# Patient Record
Sex: Female | Born: 1955
Health system: Southern US, Community
[De-identification: ages and names within clinical notes are randomized; demographics above are authoritative.]

## PROBLEM LIST (undated history)

## (undated) DIAGNOSIS — J45909 Unspecified asthma, uncomplicated: Secondary | ICD-10-CM

## (undated) DIAGNOSIS — Q185 Microstomia: Secondary | ICD-10-CM

## (undated) DIAGNOSIS — C069 Malignant neoplasm of mouth, unspecified: Secondary | ICD-10-CM

## (undated) DIAGNOSIS — K219 Gastro-esophageal reflux disease without esophagitis: Secondary | ICD-10-CM

## (undated) HISTORY — PX: WISDOM TOOTH EXTRACTION: SHX21

---

## 1998-07-01 ENCOUNTER — Ambulatory Visit (HOSPITAL_COMMUNITY): Admission: RE | Admit: 1998-07-01 | Discharge: 1998-07-01 | Payer: Self-pay | Admitting: Surgery

## 1999-07-16 ENCOUNTER — Encounter: Payer: Self-pay | Admitting: Obstetrics and Gynecology

## 1999-07-16 ENCOUNTER — Ambulatory Visit (HOSPITAL_COMMUNITY): Admission: RE | Admit: 1999-07-16 | Discharge: 1999-07-16 | Payer: Self-pay

## 2000-07-16 ENCOUNTER — Encounter: Payer: Self-pay | Admitting: Surgery

## 2000-07-16 ENCOUNTER — Ambulatory Visit (HOSPITAL_COMMUNITY): Admission: RE | Admit: 2000-07-16 | Discharge: 2000-07-16 | Payer: Self-pay | Admitting: Surgery

## 2001-07-29 ENCOUNTER — Encounter: Payer: Self-pay | Admitting: Surgery

## 2001-07-29 ENCOUNTER — Ambulatory Visit (HOSPITAL_COMMUNITY): Admission: RE | Admit: 2001-07-29 | Discharge: 2001-07-29 | Payer: Self-pay | Admitting: Surgery

## 2001-08-04 ENCOUNTER — Other Ambulatory Visit: Admission: RE | Admit: 2001-08-04 | Discharge: 2001-08-04 | Payer: Self-pay | Admitting: *Deleted

## 2002-08-15 ENCOUNTER — Ambulatory Visit (HOSPITAL_COMMUNITY): Admission: RE | Admit: 2002-08-15 | Discharge: 2002-08-15 | Payer: Self-pay | Admitting: Surgery

## 2002-08-15 ENCOUNTER — Other Ambulatory Visit: Admission: RE | Admit: 2002-08-15 | Discharge: 2002-08-15 | Payer: Self-pay | Admitting: *Deleted

## 2002-08-15 ENCOUNTER — Encounter: Payer: Self-pay | Admitting: Surgery

## 2003-09-06 ENCOUNTER — Encounter: Admission: RE | Admit: 2003-09-06 | Discharge: 2003-09-06 | Payer: Self-pay | Admitting: Surgery

## 2003-11-29 ENCOUNTER — Other Ambulatory Visit: Admission: RE | Admit: 2003-11-29 | Discharge: 2003-11-29 | Payer: Self-pay | Admitting: *Deleted

## 2004-09-10 ENCOUNTER — Encounter: Admission: RE | Admit: 2004-09-10 | Discharge: 2004-09-10 | Payer: Self-pay | Admitting: Surgery

## 2004-12-01 ENCOUNTER — Other Ambulatory Visit: Admission: RE | Admit: 2004-12-01 | Discharge: 2004-12-01 | Payer: Self-pay | Admitting: *Deleted

## 2005-02-23 ENCOUNTER — Emergency Department (HOSPITAL_COMMUNITY): Admission: EM | Admit: 2005-02-23 | Discharge: 2005-02-23 | Payer: Self-pay | Admitting: Emergency Medicine

## 2005-09-16 ENCOUNTER — Encounter: Admission: RE | Admit: 2005-09-16 | Discharge: 2005-09-16 | Payer: Self-pay | Admitting: *Deleted

## 2005-12-02 ENCOUNTER — Encounter: Payer: Self-pay | Admitting: Surgery

## 2006-01-06 ENCOUNTER — Other Ambulatory Visit: Admission: RE | Admit: 2006-01-06 | Discharge: 2006-01-06 | Payer: Self-pay | Admitting: *Deleted

## 2006-04-01 ENCOUNTER — Encounter (INDEPENDENT_AMBULATORY_CARE_PROVIDER_SITE_OTHER): Payer: Self-pay | Admitting: *Deleted

## 2006-04-01 ENCOUNTER — Ambulatory Visit (HOSPITAL_COMMUNITY): Admission: RE | Admit: 2006-04-01 | Discharge: 2006-04-01 | Payer: Self-pay | Admitting: Gastroenterology

## 2006-09-20 ENCOUNTER — Encounter: Admission: RE | Admit: 2006-09-20 | Discharge: 2006-09-20 | Payer: Self-pay | Admitting: *Deleted

## 2007-03-10 ENCOUNTER — Other Ambulatory Visit: Admission: RE | Admit: 2007-03-10 | Discharge: 2007-03-10 | Payer: Self-pay | Admitting: *Deleted

## 2007-09-22 ENCOUNTER — Encounter: Admission: RE | Admit: 2007-09-22 | Discharge: 2007-09-22 | Payer: Self-pay | Admitting: *Deleted

## 2008-09-24 ENCOUNTER — Encounter: Admission: RE | Admit: 2008-09-24 | Discharge: 2008-09-24 | Payer: Self-pay | Admitting: Obstetrics & Gynecology

## 2009-08-03 HISTORY — PX: EXCISION OF TONGUE LESION WITH LASER: SHX5823

## 2009-10-08 ENCOUNTER — Encounter: Admission: RE | Admit: 2009-10-08 | Discharge: 2009-10-08 | Payer: Self-pay | Admitting: Obstetrics & Gynecology

## 2010-01-27 ENCOUNTER — Ambulatory Visit (HOSPITAL_BASED_OUTPATIENT_CLINIC_OR_DEPARTMENT_OTHER): Admission: RE | Admit: 2010-01-27 | Discharge: 2010-01-27 | Payer: Self-pay | Admitting: Otolaryngology

## 2010-09-30 ENCOUNTER — Other Ambulatory Visit: Payer: Self-pay | Admitting: Obstetrics & Gynecology

## 2010-09-30 DIAGNOSIS — Z1231 Encounter for screening mammogram for malignant neoplasm of breast: Secondary | ICD-10-CM

## 2010-10-13 ENCOUNTER — Ambulatory Visit
Admission: RE | Admit: 2010-10-13 | Discharge: 2010-10-13 | Disposition: A | Discharge status: Home / Self Care | Payer: Commercial Managed Care - PPO | Source: Ambulatory Visit | Attending: Obstetrics & Gynecology | Admitting: Obstetrics & Gynecology

## 2010-10-13 DIAGNOSIS — Z1231 Encounter for screening mammogram for malignant neoplasm of breast: Secondary | ICD-10-CM

## 2010-12-19 NOTE — Op Note (Signed)
NAME:  Joan Boyle, Joan Boyle                 ACCOUNT NO.:  [id number]   MEDICAL RECORD NO.:  [id number]          PATIENT TYPE:  AMB   LOCATION:  ENDO                         FACILITY:  [facility]   PHYSICIAN:  James L. Malon Kindle., M.D.DATE OF BIRTH:  30-Nov-1955   DATE OF PROCEDURE:  04/01/2006  DATE OF DISCHARGE:                                 OPERATIVE REPORT   PREOPERATIVE DIAGNOSIS:  Colon cancer screening.   POSTOPERATIVE DIAGNOSIS:   PROCEDURE:  Colonoscopy and coagulation of polyp.   SCOPE USED:  Pediatric adjustable colonoscope.   SURGEON:  James L. Randa Evens, M.D.   ANESTHESIA:  Fentanyl 100 mcg and Versed 10 mg IV.   PROCEDURE:  The procedure was explained to the patient and consent obtained.  In the left lateral decubitus position the Olympus pediatric adjustable  scope was inserted and advanced.  The prep was excellent.  We were able to  reach the transverse colon without difficulty and then placed the patient on  her back.  Using some slight abdominal pressure we were able to advance down  to the cecum.  The ileocecal valve and appendiceal orifice were seen. The  scope was withdrawn.  The cecum, ascending, transverse colon were seen well  and were free of polyps.  In the proximal and descending colon a 3 mm  sessile polyp was cauterized with the hot biopsy forceps.  There was no  diverticular disease in the left colon.  No other polyps were seen in the  sigmoid or rectum.  The scope was withdrawn.  The patient tolerated the  procedure well.  There were a few hemorrhoids in the anal canal.   ASSESSMENT:  Descending colon polyp, cauterized.   PLAN:  Will check pathology, and if adenomatous will recommend routine  follow up.  Will hold nonsteroidals per the usual case.           ______________________________  Llana Aliment. Malon Kindle., M.D.     Waldron Session  D:  04/01/2006  T:  04/01/2006  Job:  [id number]   cc:   Alinda Sierras, PA

## 2011-10-14 ENCOUNTER — Other Ambulatory Visit: Payer: Self-pay | Admitting: Obstetrics & Gynecology

## 2011-10-14 DIAGNOSIS — Z1231 Encounter for screening mammogram for malignant neoplasm of breast: Secondary | ICD-10-CM

## 2011-11-02 ENCOUNTER — Ambulatory Visit
Admission: RE | Admit: 2011-11-02 | Discharge: 2011-11-02 | Disposition: A | Discharge status: Home / Self Care | Payer: 59 | Source: Ambulatory Visit | Attending: Obstetrics & Gynecology | Admitting: Obstetrics & Gynecology

## 2011-11-02 DIAGNOSIS — Z1231 Encounter for screening mammogram for malignant neoplasm of breast: Secondary | ICD-10-CM

## 2012-06-28 NOTE — Progress Notes (Signed)
Joan Boyle talked with pt

## 2012-06-29 NOTE — H&P (Signed)
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 Discussed  No change following the course of Kenalog/orabase. Anterior floor of mouth lesion, unchaged. It is approximately 8-10 mm in diameter and non-raised. No palpable adenopathy. Recommend biopsy and laser excision of this lesion. We can do this in the office under local anesthesia. Reason For Visit  Recheck mouth. Allergies  Latex Gloves MISC Penicillins; Wheezing,Rash Toradol Oral TABS. Current Meds  Calcium TABS;; RPT Vitamin D CAPS;; RPT Maxair AERO;; RPT. 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 P[facility]  Oral Surgery Tooth Extraction. Family Hx  DVT of leg (deep venous thrombosis): Sister (I82.409) Family history of atrial fibrillation: Mother,Grandfather (V17.49) (Z82.49) Family history of coronary artery disease: Father (V17.3) (Z32.49) Family history of lung cancer: Father (V16.1) (Z80.1) Family history of transient ischemic attacks: Sister (V17.1) (Z82.3). Personal Hx  Alcohol Use (History); wine 1-2 glasses weekly Caffeine use Never a smoker Never a smoker Occasional alcohol use. Signature  Electronically signed by : Serena Colonel  M.D.; 05/30/2012 8:56 PM EST.

## 2012-07-03 DIAGNOSIS — C069 Malignant neoplasm of mouth, unspecified: Secondary | ICD-10-CM

## 2012-07-03 HISTORY — DX: Malignant neoplasm of mouth, unspecified: C06.9

## 2012-07-04 ENCOUNTER — Encounter (HOSPITAL_BASED_OUTPATIENT_CLINIC_OR_DEPARTMENT_OTHER): Payer: Self-pay | Admitting: *Deleted

## 2012-07-04 ENCOUNTER — Encounter (HOSPITAL_BASED_OUTPATIENT_CLINIC_OR_DEPARTMENT_OTHER)
Admission: RE | Disposition: A | Discharge status: Home / Self Care | Payer: Self-pay | Source: Ambulatory Visit | Attending: Otolaryngology

## 2012-07-04 ENCOUNTER — Ambulatory Visit (HOSPITAL_BASED_OUTPATIENT_CLINIC_OR_DEPARTMENT_OTHER)
Admission: RE | Admit: 2012-07-04 | Discharge: 2012-07-04 | Disposition: A | Discharge status: Home / Self Care | Payer: 59 | Source: Ambulatory Visit | Attending: Otolaryngology | Admitting: Otolaryngology

## 2012-07-04 DIAGNOSIS — K1370 Unspecified lesions of oral mucosa: Secondary | ICD-10-CM | POA: Insufficient documentation

## 2012-07-04 DIAGNOSIS — Z8249 Family history of ischemic heart disease and other diseases of the circulatory system: Secondary | ICD-10-CM | POA: Insufficient documentation

## 2012-07-04 DIAGNOSIS — Z801 Family history of malignant neoplasm of trachea, bronchus and lung: Secondary | ICD-10-CM | POA: Insufficient documentation

## 2012-07-04 DIAGNOSIS — Z823 Family history of stroke: Secondary | ICD-10-CM | POA: Insufficient documentation

## 2012-07-04 DIAGNOSIS — D490 Neoplasm of unspecified behavior of digestive system: Secondary | ICD-10-CM

## 2012-07-04 DIAGNOSIS — Z880 Allergy status to penicillin: Secondary | ICD-10-CM | POA: Insufficient documentation

## 2012-07-04 HISTORY — DX: Unspecified asthma, uncomplicated: J45.909

## 2012-07-04 SURGERY — MINOR C02 LASER EXCISION OF ORAL LESION
Anesthesia: LOCAL | Site: Mouth | Wound class: Clean Contaminated

## 2012-07-04 MED ORDER — HYDROCODONE-ACETAMINOPHEN 7.5-325 MG PO TABS: 1.0000 | ORAL_TABLET | Freq: Four times a day (QID) | ORAL | Status: DC | PRN

## 2012-07-04 MED ORDER — LIDOCAINE-EPINEPHRINE 1 %-1:100000 IJ SOLN
INTRAMUSCULAR | Status: DC | PRN
  Administered 2012-07-04: 1.5 mL

## 2012-07-04 SURGICAL SUPPLY — 36 items
BLADE SURG 15 STRL LF DISP TIS (BLADE) ×1 IMPLANT
BLADE SURG 15 STRL SS (BLADE) ×1
CANISTER SUCTION 1200CC (MISCELLANEOUS) ×2 IMPLANT
CLOTH BEACON ORANGE TIMEOUT ST (SAFETY) IMPLANT
COAGULATOR SUCT SWTCH 10FR 6 (ELECTROSURGICAL) IMPLANT
DEPRESSOR TONGUE BLADE STERILE (MISCELLANEOUS) ×2 IMPLANT
ELECT COATED BLADE 2.86 ST (ELECTRODE) IMPLANT
ELECT REM PT RETURN 9FT ADLT (ELECTROSURGICAL)
ELECTRODE REM PT RTRN 9FT ADLT (ELECTROSURGICAL) IMPLANT
FILTER 7/8 IN (FILTER) ×2 IMPLANT
GAUZE SPONGE 4X4 12PLY STRL LF (GAUZE/BANDAGES/DRESSINGS) IMPLANT
GLOVE ECLIPSE 7.5 STRL STRAW (GLOVE) IMPLANT
GLOVE SKINSENSE NS SZ6.5 (GLOVE) ×1
GLOVE SKINSENSE NS SZ7.5 (GLOVE) ×1
GLOVE SKINSENSE STRL SZ6.5 (GLOVE) ×1 IMPLANT
GLOVE SKINSENSE STRL SZ7.5 (GLOVE) ×1 IMPLANT
GOWN PREVENTION PLUS XLARGE (GOWN DISPOSABLE) ×2 IMPLANT
GOWN PREVENTION PLUS XXLARGE (GOWN DISPOSABLE) IMPLANT
MARKER SKIN DUAL TIP RULER LAB (MISCELLANEOUS) IMPLANT
NEEDLE 27GAX1X1/2 (NEEDLE) IMPLANT
NEEDLE HYPO 30X.5 LL (NEEDLE) ×2 IMPLANT
NS IRRIG 1000ML POUR BTL (IV SOLUTION) ×2 IMPLANT
PACK BASIN DAY SURGERY FS (CUSTOM PROCEDURE TRAY) ×2 IMPLANT
PATTIES SURGICAL .5 X3 (DISPOSABLE) IMPLANT
PENCIL FOOT CONTROL (ELECTRODE) IMPLANT
REDUCTION FITTING 1/4 IN (FILTER) ×2 IMPLANT
SHEET MEDIUM DRAPE 40X70 STRL (DRAPES) ×2 IMPLANT
SLEEVE SCD COMPRESS KNEE MED (MISCELLANEOUS) IMPLANT
SUT SILK 3 0 PS 1 (SUTURE) IMPLANT
SUT VIC AB 3-0 SH 27 (SUTURE)
SUT VIC AB 3-0 SH 27X BRD (SUTURE) IMPLANT
SYR BULB 3OZ (MISCELLANEOUS) IMPLANT
SYR CONTROL 10ML LL (SYRINGE) ×2 IMPLANT
TOWEL OR 17X24 6PK STRL BLUE (TOWEL DISPOSABLE) ×2 IMPLANT
TUBE CONNECTING 20X1/4 (TUBING) ×2 IMPLANT
WATER STERILE IRR 1000ML POUR (IV SOLUTION) IMPLANT

## 2012-07-04 NOTE — Interval H&P Note (Signed)
History and Physical Interval Note:  07/04/2012 8:04 AM  Joan Boyle  has presented today for surgery, with the diagnosis of oral lesion   The various methods of treatment have been discussed with the patient and family. After consideration of risks, benefits and other options for treatment, the patient has consented to  Procedure(s) (LRB) with comments: MINOR C02 LASER EXCISION OF ORAL LESION (N/A) - BIOPSY OF FLOOR OF MOUTH WITH CO2 LASER AND EXCISION OF LESION  as a surgical intervention .  The patient's history has been reviewed, patient examined, no change in status, stable for surgery.  I have reviewed the patient's chart and labs.  Questions were answered to the patient's satisfaction.     Migel Hannis

## 2012-07-04 NOTE — Op Note (Signed)
OPERATIVE REPORT  DATE OF SURGERY: 07/04/2012  PATIENT:  Joan Boyle,  56 y.o. female  PRE-OPERATIVE DIAGNOSIS:  Oral lesion   POST-OPERATIVE DIAGNOSIS:  Oral lesion   PROCEDURE:  Procedure(s): MINOR C02 LASER EXCISION OF ORAL LESION  SURGEON:  Susy Frizzle, MD  ASSISTANTS: none   ANESTHESIA:   local  EBL:  0 ml  DRAINS: none   LOCAL MEDICATIONS USED:  XYLOCAINE with epi SPECIMEN:  Source of Specimen:  floor of mouth mucosal lesion  COUNTS:  YES  PROCEDURE DETAILS: The patient was taken to the operating room and placed on the operating table in the supine position drapes were applied in standard fashion. 4% Xylocaine with epinephrine was infiltrated into the floor of mouth using a 30-gauge needle. A total of approximately 1 cc was injected. A biopsy forceps was used to remove a small piece of mucosa and this was sent for pathologic evaluation. The CO2 laser was then used on a setting of 2 W continuous power to obliterate the superficial layer of mucosa. Passes were performed, suctioning and scraping off the eschar. After all the abnormal mucosa was removed, there was no bleeding. The procedure was then completed. The patient was then transferred to recovery in stable condition per   PATIENT DISPOSITION:  PACU - hemodynamically stable.

## 2012-07-21 ENCOUNTER — Encounter (HOSPITAL_BASED_OUTPATIENT_CLINIC_OR_DEPARTMENT_OTHER): Payer: Self-pay | Admitting: *Deleted

## 2012-08-01 ENCOUNTER — Encounter (HOSPITAL_BASED_OUTPATIENT_CLINIC_OR_DEPARTMENT_OTHER): Payer: Self-pay | Admitting: Anesthesiology

## 2012-08-01 ENCOUNTER — Ambulatory Visit (HOSPITAL_BASED_OUTPATIENT_CLINIC_OR_DEPARTMENT_OTHER)
Admission: RE | Admit: 2012-08-01 | Discharge: 2012-08-01 | Disposition: A | Discharge status: Home / Self Care | Payer: 59 | Source: Ambulatory Visit | Attending: Otolaryngology | Admitting: Otolaryngology

## 2012-08-01 ENCOUNTER — Encounter (HOSPITAL_BASED_OUTPATIENT_CLINIC_OR_DEPARTMENT_OTHER)
Admission: RE | Disposition: A | Discharge status: Home / Self Care | Payer: Self-pay | Source: Ambulatory Visit | Attending: Otolaryngology

## 2012-08-01 ENCOUNTER — Encounter (HOSPITAL_BASED_OUTPATIENT_CLINIC_OR_DEPARTMENT_OTHER): Payer: Self-pay

## 2012-08-01 ENCOUNTER — Ambulatory Visit (HOSPITAL_BASED_OUTPATIENT_CLINIC_OR_DEPARTMENT_OTHER): Payer: 59 | Admitting: Anesthesiology

## 2012-08-01 DIAGNOSIS — C049 Malignant neoplasm of floor of mouth, unspecified: Secondary | ICD-10-CM | POA: Insufficient documentation

## 2012-08-01 DIAGNOSIS — J45990 Exercise induced bronchospasm: Secondary | ICD-10-CM | POA: Insufficient documentation

## 2012-08-01 DIAGNOSIS — Z880 Allergy status to penicillin: Secondary | ICD-10-CM | POA: Insufficient documentation

## 2012-08-01 DIAGNOSIS — Z91040 Latex allergy status: Secondary | ICD-10-CM | POA: Insufficient documentation

## 2012-08-01 DIAGNOSIS — C069 Malignant neoplasm of mouth, unspecified: Secondary | ICD-10-CM

## 2012-08-01 DIAGNOSIS — K219 Gastro-esophageal reflux disease without esophagitis: Secondary | ICD-10-CM | POA: Insufficient documentation

## 2012-08-01 HISTORY — DX: Gastro-esophageal reflux disease without esophagitis: K21.9

## 2012-08-01 HISTORY — PX: SALIVARY STONE REMOVAL: SHX5213

## 2012-08-01 HISTORY — DX: Malignant neoplasm of mouth, unspecified: C06.9

## 2012-08-01 HISTORY — DX: Microstomia: Q18.5

## 2012-08-01 LAB — POCT HEMOGLOBIN-HEMACUE: Hemoglobin: 13.9 g/dL (ref 12.0–15.0)

## 2012-08-01 SURGERY — REMOVAL, CALCULUS, SALIVARY DUCT
Anesthesia: General | Site: Mouth | Laterality: Bilateral | Wound class: Clean Contaminated

## 2012-08-01 MED ORDER — LACTATED RINGERS IV SOLN
INTRAVENOUS | Status: DC
  Administered 2012-08-01 (×3): via INTRAVENOUS

## 2012-08-01 MED ORDER — MIDAZOLAM HCL 2 MG/2ML IJ SOLN: 1.0000 mg | INTRAMUSCULAR | Status: DC | PRN

## 2012-08-01 MED ORDER — SUCCINYLCHOLINE CHLORIDE 20 MG/ML IJ SOLN
INTRAMUSCULAR | Status: DC | PRN
  Administered 2012-08-01: 80 mg via INTRAVENOUS

## 2012-08-01 MED ORDER — NEOSTIGMINE METHYLSULFATE 1 MG/ML IJ SOLN
INTRAMUSCULAR | Status: DC | PRN
  Administered 2012-08-01: 3 mg via INTRAVENOUS

## 2012-08-01 MED ORDER — ROCURONIUM BROMIDE 100 MG/10ML IV SOLN
INTRAVENOUS | Status: DC | PRN
  Administered 2012-08-01: 20 mg via INTRAVENOUS

## 2012-08-01 MED ORDER — DEXAMETHASONE SODIUM PHOSPHATE 4 MG/ML IJ SOLN
INTRAMUSCULAR | Status: DC | PRN
  Administered 2012-08-01: 10 mg via INTRAVENOUS

## 2012-08-01 MED ORDER — LIDOCAINE HCL 4 % MT SOLN
OROMUCOSAL | Status: DC | PRN
  Administered 2012-08-01: 2 mL via TOPICAL

## 2012-08-01 MED ORDER — LIDOCAINE-EPINEPHRINE 1 %-1:100000 IJ SOLN
INTRAMUSCULAR | Status: DC | PRN
  Administered 2012-08-01: 3 mL

## 2012-08-01 MED ORDER — KETOROLAC TROMETHAMINE 30 MG/ML IJ SOLN
INTRAMUSCULAR | Status: DC | PRN
  Administered 2012-08-01: 30 mg via INTRAVENOUS

## 2012-08-01 MED ORDER — CLINDAMYCIN HCL 300 MG PO CAPS: 300.0000 mg | ORAL_CAPSULE | Freq: Three times a day (TID) | ORAL | Status: DC

## 2012-08-01 MED ORDER — EPHEDRINE SULFATE 50 MG/ML IJ SOLN
INTRAMUSCULAR | Status: DC | PRN
  Administered 2012-08-01: 10 mg via INTRAVENOUS

## 2012-08-01 MED ORDER — MIDAZOLAM HCL 5 MG/5ML IJ SOLN
INTRAMUSCULAR | Status: DC | PRN
  Administered 2012-08-01: 2 mg via INTRAVENOUS

## 2012-08-01 MED ORDER — FENTANYL CITRATE 0.05 MG/ML IJ SOLN
INTRAMUSCULAR | Status: DC | PRN
  Administered 2012-08-01: 100 ug via INTRAVENOUS

## 2012-08-01 MED ORDER — PROPOFOL 10 MG/ML IV BOLUS
INTRAVENOUS | Status: DC | PRN
  Administered 2012-08-01: 220 mg via INTRAVENOUS
  Administered 2012-08-01: 50 mg via INTRAVENOUS

## 2012-08-01 MED ORDER — CLINDAMYCIN PHOSPHATE 600 MG/50ML IV SOLN
INTRAVENOUS | Status: DC | PRN
  Administered 2012-08-01: 600 mg via INTRAVENOUS

## 2012-08-01 MED ORDER — FENTANYL CITRATE 0.05 MG/ML IJ SOLN
50.0000 ug | INTRAMUSCULAR | Status: DC | PRN
Start: 2012-08-01 — End: 2012-08-01

## 2012-08-01 MED ORDER — LIDOCAINE HCL (CARDIAC) 20 MG/ML IV SOLN
INTRAVENOUS | Status: DC | PRN
  Administered 2012-08-01: 60 mg via INTRAVENOUS

## 2012-08-01 MED ORDER — 0.9 % SODIUM CHLORIDE (POUR BTL) OPTIME
TOPICAL | Status: DC | PRN
  Administered 2012-08-01: 50 mL

## 2012-08-01 MED ORDER — OXYCODONE HCL 5 MG PO TABS: 5.0000 mg | ORAL_TABLET | Freq: Once | ORAL | Status: DC | PRN

## 2012-08-01 MED ORDER — MEPERIDINE HCL 25 MG/ML IJ SOLN: 6.2500 mg | INTRAMUSCULAR | Status: DC | PRN

## 2012-08-01 MED ORDER — PROMETHAZINE HCL 25 MG/ML IJ SOLN: 6.2500 mg | INTRAMUSCULAR | Status: DC | PRN

## 2012-08-01 MED ORDER — OXYCODONE HCL 5 MG/5ML PO SOLN: 5.0000 mg | Freq: Once | ORAL | Status: DC | PRN

## 2012-08-01 MED ORDER — HYDROMORPHONE HCL PF 1 MG/ML IJ SOLN: 0.2500 mg | INTRAMUSCULAR | Status: DC | PRN

## 2012-08-01 MED ORDER — GLYCOPYRROLATE 0.2 MG/ML IJ SOLN
INTRAMUSCULAR | Status: DC | PRN
  Administered 2012-08-01: 0.4 mg via INTRAVENOUS

## 2012-08-01 SURGICAL SUPPLY — 37 items
BLADE SURG 15 STRL LF DISP TIS (BLADE) ×1 IMPLANT
BLADE SURG 15 STRL SS (BLADE) ×1
CANISTER SUCTION 1200CC (MISCELLANEOUS) ×2 IMPLANT
CLEANER CAUTERY TIP 5X5 PAD (MISCELLANEOUS) ×1 IMPLANT
CLOTH BEACON ORANGE TIMEOUT ST (SAFETY) ×2 IMPLANT
COVER MAYO STAND STRL (DRAPES) ×2 IMPLANT
COVER TABLE BACK 60X90 (DRAPES) ×2 IMPLANT
DECANTER SPIKE VIAL GLASS SM (MISCELLANEOUS) IMPLANT
DEPRESSOR TONGUE BLADE STERILE (MISCELLANEOUS) ×2 IMPLANT
DRAPE MICROSCOPE URBAN (DRAPES) ×2 IMPLANT
ELECT COATED BLADE 2.86 ST (ELECTRODE) ×2 IMPLANT
ELECT REM PT RETURN 9FT ADLT (ELECTROSURGICAL)
ELECTRODE REM PT RTRN 9FT ADLT (ELECTROSURGICAL) IMPLANT
GLOVE ECLIPSE 7.5 STRL STRAW (GLOVE) IMPLANT
GLOVE INDICATOR 7.0 STRL GRN (GLOVE) ×4 IMPLANT
GLOVE SKINSENSE NS SZ6.5 (GLOVE) ×1
GLOVE SKINSENSE NS SZ7.5 (GLOVE) ×1
GLOVE SKINSENSE STRL SZ6.5 (GLOVE) ×1 IMPLANT
GLOVE SKINSENSE STRL SZ7.5 (GLOVE) ×1 IMPLANT
GOWN PREVENTION PLUS XLARGE (GOWN DISPOSABLE) ×4 IMPLANT
MARKER SKIN DUAL TIP RULER LAB (MISCELLANEOUS) IMPLANT
NEEDLE 27GAX1X1/2 (NEEDLE) ×2 IMPLANT
PACK BASIN DAY SURGERY FS (CUSTOM PROCEDURE TRAY) ×2 IMPLANT
PAD CLEANER CAUTERY TIP 5X5 (MISCELLANEOUS) ×1
PENCIL FOOT CONTROL (ELECTRODE) ×2 IMPLANT
SHEET MEDIUM DRAPE 40X70 STRL (DRAPES) ×2 IMPLANT
SPONGE GAUZE 2X2 8PLY STRL LF (GAUZE/BANDAGES/DRESSINGS) IMPLANT
SUT CHROMIC 4 0 P 3 18 (SUTURE) IMPLANT
SUT PLAIN 5 0 P 3 18 (SUTURE) ×2 IMPLANT
SUT SILK 3 0 PS 1 (SUTURE) ×2 IMPLANT
SUT VIC AB 4-0 P-3 18XBRD (SUTURE) ×1 IMPLANT
SUT VIC AB 4-0 P3 18 (SUTURE) ×1
SYR CONTROL 10ML LL (SYRINGE) ×2 IMPLANT
TOWEL OR 17X24 6PK STRL BLUE (TOWEL DISPOSABLE) ×2 IMPLANT
TUBE CONNECTING 20X1/4 (TUBING) ×2 IMPLANT
WATER STERILE IRR 1000ML POUR (IV SOLUTION) IMPLANT
YANKAUER SUCT BULB TIP NO VENT (SUCTIONS) IMPLANT

## 2012-08-01 NOTE — Transfer of Care (Signed)
Immediate Anesthesia Transfer of Care Note  Patient: Joan Boyle  Procedure(s) Performed: Procedure(s) (LRB) with comments: SALIVARY STONE REMOVAL (Bilateral) - FLOOR OF MOUTH EXCISION POSSIBLE SUBMANDIBULAR DUCT RE-ROUTE  Patient Location: PACU  Anesthesia Type:General  Level of Consciousness: sedated  Airway & Oxygen Therapy: Patient Spontanous Breathing and Patient connected to face mask oxygen  Post-op Assessment: Report given to PACU RN and Post -op Vital signs reviewed and stable  Post vital signs: Reviewed and stable  Complications: No apparent anesthesia complications

## 2012-08-01 NOTE — H&P (Signed)
Joan Boyle is an 56 y.o. female.   Chief Complaint: Oral cancer HPI: very early scca of floor of mouth.  Past Medical History  Diagnosis Date  . GERD (gastroesophageal reflux disease)   . Asthma     exercise-induced; prn inhaler  . Abnormally small mouth   . 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 Wisdom tooth extraction   . Excision of tongue lesion with laser 2011    History reviewed. No pertinent family history. Social History:  reports that she has never smoked. She has never used smokeless tobacco. She reports that she drinks alcohol. She reports that she does not use illicit drugs.  Allergies:  Allergies  Allergen Reactions  . Latex Swelling    SWELLING OF EYES  . Penicillins Rash    Medications Prior to Admission  Medication Sig Dispense Refill  . famotidine (PEPCID) 20 MG tablet Take 20 mg by mouth 2 (two) times daily.      Marland Kitchen ibuprofen (ADVIL,MOTRIN) 200 MG tablet Take 200 mg by mouth every 6 (six) hours as needed.      . Multiple Vitamin (MULTIVITAMIN) tablet Take 1 tablet by mouth daily.      . pirbuterol (MAXAIR) 200 MCG/INH inhaler Inhale 2 puffs into the lungs 4 (four) times daily.        Results for orders placed during the hospital encounter of 08/01/12 (from the past 48 hour(s))  POCT HEMOGLOBIN-HEMACUE     Status: Normal   Collection Time   08/01/12  8:24 AM      Component Value Range Comment   Hemoglobin 13.9  12.0 - 15.0 g/dL    No results found.  ROS: otherwise negative  Blood pressure 130/75, pulse 76, temperature 97.5 F (36.4 C), temperature source Oral, resp. rate 16, height 5\' 4"  (1.626 m), weight 137 lb 6 oz (62.313 kg), SpO2 98.00%.  PHYSICAL EXAM: Overall appearance:  Healthy appearing, in no distress Head:  Normocephalic, atraumatic. Ears: External auditory canals are clear; tympanic membranes are intact and the middle ears are free of any effusion. Nose: External nose is healthy in  appearance. Internal nasal exam free of any lesions or obstruction. Oral Cavity/pharynx:  Small, superficial lesion anterior floor of mouth.. Hypopharynx/Larynx: no signs of any mucosal lesions or masses identified. Vocal cords move normally. Neuro:  No identifiable neurologic deficits. Neck: No palpable neck masses.  Studies Reviewed: none    Assessment/Plan Wide local excision.  Mariha Sleeper 08/01/2012, 8:33 AM

## 2012-08-01 NOTE — Op Note (Signed)
OPERATIVE REPORT  DATE OF SURGERY: 08/01/2012  PATIENT:  Joan Boyle,  56 y.o. female  PRE-OPERATIVE DIAGNOSIS:  CARCINOMA OF ORAL CAVITY   POST-OPERATIVE DIAGNOSIS:  CARCINOMA OF ORAL CAVITY  PROCEDURE:  Procedure(s): SALIVARY STONE REMOVAL  SURGEON:  Susy Frizzle, MD  ASSISTANTS: none  ANESTHESIA:   General   EBL:  10 ml  DRAINS: none  LOCAL MEDICATIONS USED:  1% xylocaine with epinephrine  SPECIMEN:  Anterior floor of mouth  COUNTS:  Correct  PROCEDURE DETAILS: The patient was taken to the operating room and placed on the operating table in the supine position. Following induction of general endotracheal anesthesia, the mouth was draped in a standard fashion. A rubber bite guard was used on the left side. The floor of mouth was inspected and electrocautery was used to mark the mucosa along the proposed incision lines. 1% Xylocaine with epinephrine was infiltrated around the entire incision. The resection was accomplished using the cautery. The left side the specimen was marked with a single suture and the anterior margin was marked with a double suture. During the dissection, the cement the ducts were identified and transected. The edges were then reapproximated to the mucosal edges using interrupted 5-0 chromic suture. The resection was then completed. Frozen section analysis revealed residual or seroma, the right margin was close at about 1 mm. All remaining margins were clear except for diffuse dysplasia. An additional right margin was taken, approximately 1 cm of mucosa and submucosal tissue. This was sent for permanent. The wound was left open to granulate. There is gross overflow from both of the ducts. There is no bleeding. Patient was awakened, extubated and transferred to recovery in stable condition.    PATIENT DISPOSITION:  To PACU, stable

## 2012-08-01 NOTE — Anesthesia Preprocedure Evaluation (Signed)
Anesthesia Evaluation  Patient identified by MRN, date of birth, ID band Patient awake    Reviewed: Allergy & Precautions, H&P , NPO status , Patient's Chart, lab work & pertinent test results  History of Anesthesia Complications Negative for: history of anesthetic complications  Airway Mallampati: I  Neck ROM: full    Dental No notable dental hx. (+) Teeth Intact   Pulmonary asthma ,  breath sounds clear to auscultation  Pulmonary exam normal       Cardiovascular negative cardio ROS  IRhythm:regular Rate:Normal     Neuro/Psych negative neurological ROS  negative psych ROS   GI/Hepatic negative GI ROS, Neg liver ROS, GERD-  ,  Endo/Other  negative endocrine ROS  Renal/GU negative Renal ROS  negative genitourinary   Musculoskeletal   Abdominal   Peds  Hematology negative hematology ROS (+)   Anesthesia Other Findings   Reproductive/Obstetrics negative OB ROS                           Anesthesia Physical Anesthesia Plan  ASA: I  Anesthesia Plan: General and General ETT   Post-op Pain Management:    Induction:   Airway Management Planned:   Additional Equipment:   Intra-op Plan:   Post-operative Plan:   Informed Consent: I have reviewed the patients History and Physical, chart, labs and discussed the procedure including the risks, benefits and alternatives for the proposed anesthesia with the patient or authorized representative who has indicated his/her understanding and acceptance.     Plan Discussed with: CRNA and Surgeon  Anesthesia Plan Comments:         Anesthesia Quick Evaluation

## 2012-08-01 NOTE — Anesthesia Procedure Notes (Signed)
Procedure Name: Intubation Date/Time: 08/01/2012 9:04 AM Performed by: Burna Cash Pre-anesthesia Checklist: Patient identified, Emergency Drugs available, Suction available and Patient being monitored Patient Re-evaluated:Patient Re-evaluated prior to inductionOxygen Delivery Method: Circle System Utilized Preoxygenation: Pre-oxygenation with 100% oxygen Intubation Type: IV induction Ventilation: Mask ventilation without difficulty Laryngoscope Size: Mac and 3 Grade View: Grade I Tube type: Oral Tube size: 7.0 mm Number of attempts: 1 Airway Equipment and Method: stylet Placement Confirmation: ETT inserted through vocal cords under direct vision,  positive ETCO2 and breath sounds checked- equal and bilateral Secured at: 21 cm Tube secured with: Tape Dental Injury: Teeth and Oropharynx as per pre-operative assessment

## 2012-08-01 NOTE — Anesthesia Postprocedure Evaluation (Signed)
  Anesthesia Post-op Note  Patient: Joan Boyle  Procedure(s) Performed: Procedure(s) (LRB) with comments: SALIVARY STONE REMOVAL (Bilateral) - FLOOR OF MOUTH EXCISION POSSIBLE SUBMANDIBULAR DUCT RE-ROUTE  Patient Location: PACU  Anesthesia Type:General  Level of Consciousness: awake and alert   Airway and Oxygen Therapy: Patient Spontanous Breathing  Post-op Pain: mild  Post-op Assessment: Post-op Vital signs reviewed  Post-op Vital Signs: stable  Complications: No apparent anesthesia complications

## 2012-08-02 ENCOUNTER — Encounter (HOSPITAL_BASED_OUTPATIENT_CLINIC_OR_DEPARTMENT_OTHER): Payer: Self-pay | Admitting: Otolaryngology

## 2012-09-17 ENCOUNTER — Other Ambulatory Visit: Payer: Self-pay

## 2012-10-17 ENCOUNTER — Other Ambulatory Visit (HOSPITAL_COMMUNITY): Payer: Self-pay | Admitting: Otolaryngology

## 2012-10-17 DIAGNOSIS — IMO0002 Reserved for concepts with insufficient information to code with codable children: Secondary | ICD-10-CM

## 2012-10-19 ENCOUNTER — Ambulatory Visit (HOSPITAL_COMMUNITY)
Admission: RE | Admit: 2012-10-19 | Discharge: 2012-10-19 | Disposition: A | Discharge status: Home / Self Care | Payer: 59 | Source: Ambulatory Visit | Attending: Otolaryngology | Admitting: Otolaryngology

## 2012-10-19 DIAGNOSIS — IMO0002 Reserved for concepts with insufficient information to code with codable children: Secondary | ICD-10-CM

## 2012-10-19 DIAGNOSIS — C049 Malignant neoplasm of floor of mouth, unspecified: Secondary | ICD-10-CM | POA: Insufficient documentation

## 2012-10-19 MED ORDER — IOHEXOL 300 MG/ML  SOLN
80.0000 mL | Freq: Once | INTRAMUSCULAR | Status: AC | PRN
  Administered 2012-10-19: 80 mL via INTRAVENOUS

## 2012-10-24 ENCOUNTER — Other Ambulatory Visit: Payer: Self-pay | Admitting: Otolaryngology

## 2012-10-24 ENCOUNTER — Other Ambulatory Visit: Payer: Self-pay

## 2012-10-24 DIAGNOSIS — Z1231 Encounter for screening mammogram for malignant neoplasm of breast: Secondary | ICD-10-CM

## 2012-11-28 ENCOUNTER — Ambulatory Visit
Admission: RE | Admit: 2012-11-28 | Discharge: 2012-11-28 | Disposition: A | Discharge status: Home / Self Care | Payer: 59 | Source: Ambulatory Visit

## 2012-11-28 DIAGNOSIS — Z1231 Encounter for screening mammogram for malignant neoplasm of breast: Secondary | ICD-10-CM

## 2013-01-24 ENCOUNTER — Encounter (HOSPITAL_BASED_OUTPATIENT_CLINIC_OR_DEPARTMENT_OTHER): Payer: Self-pay | Admitting: *Deleted

## 2013-01-29 NOTE — H&P (Signed)
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 Discussed  She has a slight soreness in the lateral anterior floor of mouth on the right for the past couple of weeks. For the past week or so, she has a little bit of sore throat and right ear pain. Otherwise doing well. On exam, there is no palpable adenopathy. The left submandibular gland is slightly full and tender but this is typical. Oropharynx is clear. Oral cavity reveals a small patch of leukoplakia along the right anterior lateral tongue. There is a tiny red spots along the lateral anterior floor of mouth which is corresponding to the tender spot. Given her history, recommend we go ahead and excise the red spots and laser off the leukoplakia. Reason For Visit  Recheck mouth. Allergies  Latex Gloves MISC Penicillins; Wheezing,Rash Toradol Oral TABS. Current Meds  Maxair AERO;; RPT Calcium TABS;; RPT Vitamin D CAPS;; RPT. 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 [facility]  Oral Surgery 09Dec2013; bx floor of mouth * Dec 30,2013 also, 08 Aug 2012 Oral Surgery Tooth Extraction 02Dec2013. Family Hx  DVT of leg (deep venous thrombosis): Sister (I82.409) Family history of atrial fibrillation: Mother,Grandfather (V17.49) (Z82.49) Family history of coronary artery disease: Father  (V17.3) (Z69.49) Family history of lung cancer: Father (V16.1) (Z80.1) Family history of transient ischemic attacks: Sister (V17.1) (Z82.3). Personal Hx  Alcohol Use (History); wine 1-2 glasses weekly Caffeine use (F15.929) Never a smoker Never a smoker Occasional alcohol use. Signature  Electronically signed by : Serena Colonel  M.D.; 01/17/2013 1:23 PM EST.

## 2013-01-30 ENCOUNTER — Ambulatory Visit (HOSPITAL_BASED_OUTPATIENT_CLINIC_OR_DEPARTMENT_OTHER)
Admission: RE | Admit: 2013-01-30 | Discharge: 2013-01-30 | Disposition: A | Discharge status: Home / Self Care | Payer: 59 | Source: Ambulatory Visit | Attending: Otolaryngology | Admitting: Otolaryngology

## 2013-01-30 ENCOUNTER — Encounter (HOSPITAL_BASED_OUTPATIENT_CLINIC_OR_DEPARTMENT_OTHER): Payer: Self-pay | Admitting: Anesthesiology

## 2013-01-30 ENCOUNTER — Encounter (HOSPITAL_BASED_OUTPATIENT_CLINIC_OR_DEPARTMENT_OTHER)
Admission: RE | Disposition: A | Discharge status: Home / Self Care | Payer: Self-pay | Source: Ambulatory Visit | Attending: Otolaryngology

## 2013-01-30 ENCOUNTER — Encounter (HOSPITAL_BASED_OUTPATIENT_CLINIC_OR_DEPARTMENT_OTHER): Payer: Self-pay

## 2013-01-30 DIAGNOSIS — Z91040 Latex allergy status: Secondary | ICD-10-CM | POA: Insufficient documentation

## 2013-01-30 DIAGNOSIS — K135 Oral submucous fibrosis: Secondary | ICD-10-CM | POA: Insufficient documentation

## 2013-01-30 DIAGNOSIS — Z79899 Other long term (current) drug therapy: Secondary | ICD-10-CM | POA: Insufficient documentation

## 2013-01-30 DIAGNOSIS — K1370 Unspecified lesions of oral mucosa: Secondary | ICD-10-CM

## 2013-01-30 DIAGNOSIS — Z880 Allergy status to penicillin: Secondary | ICD-10-CM | POA: Insufficient documentation

## 2013-01-30 DIAGNOSIS — J45990 Exercise induced bronchospasm: Secondary | ICD-10-CM | POA: Insufficient documentation

## 2013-01-30 DIAGNOSIS — K1329 Other disturbances of oral epithelium, including tongue: Secondary | ICD-10-CM | POA: Insufficient documentation

## 2013-01-30 DIAGNOSIS — Z888 Allergy status to other drugs, medicaments and biological substances status: Secondary | ICD-10-CM | POA: Insufficient documentation

## 2013-01-30 SURGERY — MINOR C02 LASER EXCISION OF ORAL LESION
Anesthesia: LOCAL | Site: Mouth | Wound class: Clean Contaminated

## 2013-01-30 MED ORDER — LIDOCAINE VISCOUS 2 % MT SOLN: 5.0000 mL | OROMUCOSAL | Status: DC | PRN

## 2013-01-30 MED ORDER — MIDAZOLAM HCL 2 MG/2ML IJ SOLN: 1.0000 mg | INTRAMUSCULAR | Status: DC | PRN

## 2013-01-30 MED ORDER — LIDOCAINE-EPINEPHRINE 1 %-1:100000 IJ SOLN
INTRAMUSCULAR | Status: DC | PRN
  Administered 2013-01-30: 3 mL

## 2013-01-30 MED ORDER — LACTATED RINGERS IV SOLN: INTRAVENOUS | Status: DC

## 2013-01-30 MED ORDER — FENTANYL CITRATE 0.05 MG/ML IJ SOLN: 50.0000 ug | INTRAMUSCULAR | Status: DC | PRN

## 2013-01-30 MED ORDER — HYDROCODONE-ACETAMINOPHEN 7.5-325 MG PO TABS: 1.0000 | ORAL_TABLET | Freq: Four times a day (QID) | ORAL | Status: DC | PRN

## 2013-01-30 SURGICAL SUPPLY — 41 items
BLADE SURG 15 STRL LF DISP TIS (BLADE) ×1 IMPLANT
BLADE SURG 15 STRL SS (BLADE) ×1
BUR EGG 3PK/BX (BURR) IMPLANT
BUR FISSURE CARBIDE (BURR) IMPLANT
BUR ROUND CARBIDE (BURR) IMPLANT
BUR SIDE CUT (BURR) IMPLANT
BUR SIDE CUT 44.8 STRL (BURR) IMPLANT
BURR SIDE CUT 44.8 STRL (BURR)
CANISTER SUCTION 1200CC (MISCELLANEOUS) ×2 IMPLANT
CATH ROBINSON RED A/P 12FR (CATHETERS) ×2 IMPLANT
CLOTH BEACON ORANGE TIMEOUT ST (SAFETY) ×2 IMPLANT
COAGULATOR SUCT SWTCH 10FR 6 (ELECTROSURGICAL) IMPLANT
COVER MAYO STAND STRL (DRAPES) IMPLANT
ELECT COATED BLADE 2.86 ST (ELECTRODE) IMPLANT
ELECT REM PT RETURN 9FT ADLT (ELECTROSURGICAL)
ELECTRODE REM PT RTRN 9FT ADLT (ELECTROSURGICAL) IMPLANT
GLOVE ECLIPSE 7.5 STRL STRAW (GLOVE) IMPLANT
GLOVE SURG SS PI 7.0 STRL IVOR (GLOVE) ×2 IMPLANT
GOWN PREVENTION PLUS XLARGE (GOWN DISPOSABLE) IMPLANT
HEMOSTAT SNOW SURGICEL 2X4 (HEMOSTASIS) IMPLANT
HEMOSTAT SURGICEL .5X2 ABSORB (HEMOSTASIS) IMPLANT
HEMOSTAT SURGICEL 2X14 (HEMOSTASIS) IMPLANT
MARKER SKIN DUAL TIP RULER LAB (MISCELLANEOUS) IMPLANT
NEEDLE 27GAX1X1/2 (NEEDLE) IMPLANT
NS IRRIG 1000ML POUR BTL (IV SOLUTION) ×2 IMPLANT
PACK BASIN DAY SURGERY FS (CUSTOM PROCEDURE TRAY) IMPLANT
PATTIES SURGICAL .5 X3 (DISPOSABLE) IMPLANT
PENCIL FOOT CONTROL (ELECTRODE) IMPLANT
SHEET MEDIUM DRAPE 40X70 STRL (DRAPES) IMPLANT
SLEEVE SCD COMPRESS KNEE MED (MISCELLANEOUS) IMPLANT
SOLUTION BUTLER CLEAR DIP (MISCELLANEOUS) IMPLANT
SPONGE TONSIL 1 RF SGL (DISPOSABLE) IMPLANT
SPONGE TONSIL 1.25 RF SGL STRG (GAUZE/BANDAGES/DRESSINGS) IMPLANT
SUT SILK 3 0 PS 1 (SUTURE) IMPLANT
SUT VIC AB 3-0 SH 27 (SUTURE) ×1
SUT VIC AB 3-0 SH 27X BRD (SUTURE) ×1 IMPLANT
SYR BULB 3OZ (MISCELLANEOUS) ×2 IMPLANT
SYR CONTROL 10ML LL (SYRINGE) IMPLANT
TOWEL OR 17X24 6PK STRL BLUE (TOWEL DISPOSABLE) ×2 IMPLANT
TUBE CONNECTING 20X1/4 (TUBING) ×4 IMPLANT
TUBE SALEM SUMP 16 FR W/ARV (TUBING) IMPLANT

## 2013-01-30 NOTE — Interval H&P Note (Signed)
History and Physical Interval Note:  01/30/2013 8:04 AM  Joan Boyle  has presented today for surgery, with the diagnosis of Oral Lesion  The various methods of treatment have been discussed with the patient and family. After consideration of risks, benefits and other options for treatment, the patient has consented to  Procedure(s): ORAL BIOPSY AND LASER EXCISION OF ORAL LESION  (N/A) as a surgical intervention .  The patient's history has been reviewed, patient examined, no change in status, stable for surgery.  I have reviewed the patient's chart and labs.  Questions were answered to the patient's satisfaction.     Joan Boyle

## 2013-01-30 NOTE — Op Note (Signed)
OPERATIVE REPORT  DATE OF SURGERY: 01/30/2013  PATIENT:  Joan Boyle,  57 y.o. female  PRE-OPERATIVE DIAGNOSIS:  Oral Lesion  POST-OPERATIVE DIAGNOSIS:  same  PROCEDURE:  Procedure(s): MINOR C02 LASER EXCISION OF ORAL LESION  SURGEON:  Susy Frizzle, MD  ASSISTANTS: none  ANESTHESIA:   local  EBL:  2 ml  DRAINS: none  LOCAL MEDICATIONS USED:  1% Xylocaine with epinephrine  SPECIMEN:  Right oral tongue biopsy  COUNTS:  Correct  PROCEDURE DETAILS: The patient was taken to the operating room and placed on the operating table in the supine position. Lidocaine with epinephrine solution was infiltrated into the right oral tongue and floor of mouth area. A through cut forceps was used to biopsy the slightly firm nodular area along the right lateral oral tongue. The remainder of the leukoplakia was ablated with the carbon dioxide laser at a setting of 3 W continuous power. Superficial layer of mucosa was lasered off until there was no further remnant of leukoplakia. There were no other lesions identified. She tolerated this well. She was transferred to recovery room in stable condition.    PATIENT DISPOSITION:  To PACU, stable

## 2013-01-30 NOTE — OR Nursing (Signed)
Patient tolerated procedure well and voices understanding of post-operative instructions; denies pain or dizziness.   Discharged ambulatory to lobby with husband.

## 2013-06-08 ENCOUNTER — Other Ambulatory Visit: Payer: Self-pay

## 2013-11-27 ENCOUNTER — Other Ambulatory Visit: Payer: Self-pay

## 2013-11-27 DIAGNOSIS — Z1231 Encounter for screening mammogram for malignant neoplasm of breast: Secondary | ICD-10-CM

## 2013-12-08 NOTE — H&P (Signed)
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 Discussed  Tongue lesion has gotten a little bit larger. She had a brushing done by her dentist and this revealed dysplasia. On exam today, there is no palpable mass but the visible area is slightly raised. Recommend we go ahead and proceed with laser ablation of this again. Floor mouth is soft and free of lesions. No palpable adenopathy. No other mucosal lesions. Reason For Visit  Check oral cavity. Allergies  Latex Gloves MISC Penicillins; Wheezing,Rash Toradol Oral TABS. Current Meds  Calcium TABS;; RPT Vitamin D CAPS;; RPT Maxair AERO;; RPT EpiPen DEVI;; RPT Multivitamins TABS;; RPT. 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 [facility]  Oral Surgery [phone]; bx floor of mouth * Dec 30,2013 also, 08 Aug 2012 Oral Surgery Tooth Extraction [phone] Removal Of Lesion; tongue 2010 and 2010 Removal Of Lesion 2011; granuloma from tongue. Family Hx  DVT of leg (deep venous thrombosis): Sister (I82.409) Family history of atrial fibrillation: Mother,Grandfather (V17.49) (Z82.49) Family history of coronary artery disease: Father (V17.3) (Z77.49) Family history of  hypertension (V17.49) (Z82.49) Family history of lung cancer: Father (V16.1) (Z80.1) Family history of transient ischemic attacks: Sister (V17.1) (Z82.3). Personal Hx  Alcohol use [id number] (F10.99); social rare Alcohol Use (History); wine 1-2 glasses weekly Caffeine use (Z78.9) Never a smoker Never a smoker Occasional alcohol use. Signature  Electronically signed by : [name]  M.D.; 12/06/2013 10:52 AM EST.

## 2013-12-11 ENCOUNTER — Encounter (HOSPITAL_BASED_OUTPATIENT_CLINIC_OR_DEPARTMENT_OTHER)
Admission: RE | Disposition: A | Discharge status: Home / Self Care | Payer: Self-pay | Source: Ambulatory Visit | Attending: Otolaryngology

## 2013-12-11 ENCOUNTER — Ambulatory Visit: Payer: 59

## 2013-12-11 ENCOUNTER — Ambulatory Visit (HOSPITAL_BASED_OUTPATIENT_CLINIC_OR_DEPARTMENT_OTHER)
Admission: RE | Admit: 2013-12-11 | Discharge: 2013-12-11 | Disposition: A | Discharge status: Home / Self Care | Payer: 59 | Source: Ambulatory Visit | Attending: Otolaryngology | Admitting: Otolaryngology

## 2013-12-11 ENCOUNTER — Encounter (HOSPITAL_BASED_OUTPATIENT_CLINIC_OR_DEPARTMENT_OTHER): Payer: Self-pay | Admitting: *Deleted

## 2013-12-11 DIAGNOSIS — Z91040 Latex allergy status: Secondary | ICD-10-CM | POA: Insufficient documentation

## 2013-12-11 DIAGNOSIS — D101 Benign neoplasm of tongue: Secondary | ICD-10-CM | POA: Insufficient documentation

## 2013-12-11 DIAGNOSIS — K1379 Other lesions of oral mucosa: Secondary | ICD-10-CM

## 2013-12-11 DIAGNOSIS — J45990 Exercise induced bronchospasm: Secondary | ICD-10-CM | POA: Insufficient documentation

## 2013-12-11 DIAGNOSIS — M26609 Unspecified temporomandibular joint disorder, unspecified side: Secondary | ICD-10-CM | POA: Insufficient documentation

## 2013-12-11 DIAGNOSIS — Z87898 Personal history of other specified conditions: Secondary | ICD-10-CM | POA: Insufficient documentation

## 2013-12-11 HISTORY — PX: MINOR C02 LASER EXCISION OF ORAL LESION: SHX6356

## 2013-12-11 SURGERY — MINOR C02 LASER EXCISION OF ORAL LESION
Anesthesia: LOCAL | Site: Mouth | Laterality: Right

## 2013-12-11 MED ORDER — LIDOCAINE-EPINEPHRINE 1 %-1:100000 IJ SOLN
INTRAMUSCULAR | Status: AC
  Filled 2013-12-11: qty 1

## 2013-12-11 MED ORDER — HYDROCODONE-ACETAMINOPHEN 7.5-325 MG PO TABS: 1.0000 | ORAL_TABLET | Freq: Four times a day (QID) | ORAL | Status: DC | PRN

## 2013-12-11 MED ORDER — LIDOCAINE-EPINEPHRINE 1 %-1:100000 IJ SOLN
INTRAMUSCULAR | Status: DC | PRN
  Administered 2013-12-11: 2.5 mL

## 2013-12-11 MED ORDER — BACITRACIN ZINC 500 UNIT/GM EX OINT
TOPICAL_OINTMENT | CUTANEOUS | Status: AC
  Filled 2013-12-11: qty 0.9

## 2013-12-11 MED ORDER — OXYMETAZOLINE HCL 0.05 % NA SOLN
NASAL | Status: AC
Start: 2013-12-11 — End: 2013-12-11
  Filled 2013-12-11: qty 15

## 2013-12-11 SURGICAL SUPPLY — 30 items
CANISTER SUCT 1200ML W/VALVE (MISCELLANEOUS) IMPLANT
COAGULATOR SUCT SWTCH 10FR 6 (ELECTROSURGICAL) IMPLANT
DEPRESSOR TONGUE BLADE STERILE (MISCELLANEOUS) ×4 IMPLANT
ELECT COATED BLADE 2.86 ST (ELECTRODE) IMPLANT
ELECT REM PT RETURN 9FT ADLT (ELECTROSURGICAL)
ELECTRODE REM PT RTRN 9FT ADLT (ELECTROSURGICAL) IMPLANT
FILTER 7/8 IN (FILTER) ×2 IMPLANT
GLOVE ECLIPSE 7.5 STRL STRAW (GLOVE) IMPLANT
GLOVE SURG SS PI 7.0 STRL IVOR (GLOVE) ×2 IMPLANT
GLOVE SURG SS PI 7.5 STRL IVOR (GLOVE) ×2 IMPLANT
GOWN STRL REUS W/ TWL LRG LVL3 (GOWN DISPOSABLE) IMPLANT
GOWN STRL REUS W/TWL LRG LVL3 (GOWN DISPOSABLE)
NEEDLE 27GAX1X1/2 (NEEDLE) ×2 IMPLANT
NEEDLE HYPO 30GX1 BEV (NEEDLE) IMPLANT
NS IRRIG 1000ML POUR BTL (IV SOLUTION) ×2 IMPLANT
PACK BASIN DAY SURGERY FS (CUSTOM PROCEDURE TRAY) ×2 IMPLANT
PATTIES SURGICAL .5 X3 (DISPOSABLE) IMPLANT
PENCIL FOOT CONTROL (ELECTRODE) IMPLANT
REDUCTION FITTING 1/4 IN (FILTER) ×2 IMPLANT
SHEET MEDIUM DRAPE 40X70 STRL (DRAPES) ×2 IMPLANT
SLEEVE SCD COMPRESS KNEE MED (MISCELLANEOUS) IMPLANT
SPONGE GAUZE 4X4 12PLY STER LF (GAUZE/BANDAGES/DRESSINGS) IMPLANT
SUT SILK 3 0 PS 1 (SUTURE) IMPLANT
SUT VIC AB 3-0 SH 27 (SUTURE)
SUT VIC AB 3-0 SH 27X BRD (SUTURE) IMPLANT
SYR BULB 3OZ (MISCELLANEOUS) ×2 IMPLANT
SYR CONTROL 10ML LL (SYRINGE) ×2 IMPLANT
TOWEL OR 17X24 6PK STRL BLUE (TOWEL DISPOSABLE) ×2 IMPLANT
TUBE CONNECTING 20X1/4 (TUBING) ×2 IMPLANT
WATER STERILE IRR 1000ML POUR (IV SOLUTION) ×2 IMPLANT

## 2013-12-11 NOTE — Op Note (Signed)
OPERATIVE REPORT  DATE OF SURGERY: 12/11/2013  PATIENT:  Joan Boyle,  58 y.o. female  PRE-OPERATIVE DIAGNOSIS:  TONGUE LESION HX OF ORAL CANCER FLOOR OF MOUTH   POST-OPERATIVE DIAGNOSIS:  TONGUE LESION HX OF ORAL CANCER FLOOR OF MOUTH  PROCEDURE:  Procedure(s): CO2 LASER ABLATION RIGHT SIDE OF TONGUE   (MINOR PROCEDURE)   SURGEON:  Beckie Salts, MD  ASSISTANTS: none  ANESTHESIA:   local  EBL:  0 ml  DRAINS: None  LOCAL MEDICATIONS USED:  One percent Xylocaine with epinephrine  SPECIMEN:  Tongue biopsy  COUNTS:  Correct  PROCEDURE DETAILS: The patient was taken to the operating room and placed on the operating table in the supine position. Wet towels were applied around the face. Eye protection was used for laser. The right side of the tongue was injected with local anesthetic solution. The CO2 laser was used on a setting of 2 W continuous power to ablate the entire lesion. Prior to the ablation, a small biopsy was taken. There was no bleeding. She tolerated this well.    PATIENT DISPOSITION:  To PACU, stable

## 2013-12-11 NOTE — Interval H&P Note (Signed)
History and Physical Interval Note:  12/11/2013 8:36 AM  Joan Boyle  has presented today for surgery, with the diagnosis of TONGUE LESION HX OF ORAL CANCER FLOOR OF MOUTH   The various methods of treatment have been discussed with the patient and family. After consideration of risks, benefits and other options for treatment, the patient has consented to  Procedure(s): CO2 LASER ABLATION RIGHT SIDE OF TONGUE   (MINOR PROCEDURE)  (Right) as a surgical intervention .  The patient's history has been reviewed, patient examined, no change in status, stable for surgery.  I have reviewed the patient's chart and labs.  Questions were answered to the patient's satisfaction.     [name]

## 2013-12-12 ENCOUNTER — Encounter (HOSPITAL_BASED_OUTPATIENT_CLINIC_OR_DEPARTMENT_OTHER): Payer: Self-pay | Admitting: Otolaryngology

## 2013-12-21 ENCOUNTER — Ambulatory Visit
Admission: RE | Admit: 2013-12-21 | Discharge: 2013-12-21 | Disposition: A | Discharge status: Home / Self Care | Payer: 59 | Source: Ambulatory Visit

## 2013-12-21 DIAGNOSIS — Z1231 Encounter for screening mammogram for malignant neoplasm of breast: Secondary | ICD-10-CM

## 2014-01-10 ENCOUNTER — Other Ambulatory Visit: Payer: Self-pay

## 2014-06-18 ENCOUNTER — Other Ambulatory Visit: Payer: Self-pay | Admitting: Obstetrics and Gynecology

## 2014-06-19 LAB — CYTOLOGY - PAP

## 2014-12-14 ENCOUNTER — Other Ambulatory Visit: Payer: Self-pay

## 2014-12-14 DIAGNOSIS — Z1231 Encounter for screening mammogram for malignant neoplasm of breast: Secondary | ICD-10-CM

## 2015-01-09 ENCOUNTER — Ambulatory Visit
Admission: RE | Admit: 2015-01-09 | Discharge: 2015-01-09 | Disposition: A | Discharge status: Home / Self Care | Payer: 59 | Source: Ambulatory Visit

## 2015-01-09 DIAGNOSIS — Z1231 Encounter for screening mammogram for malignant neoplasm of breast: Secondary | ICD-10-CM

## 2015-08-20 DIAGNOSIS — Z85819 Personal history of malignant neoplasm of unspecified site of lip, oral cavity, and pharynx: Secondary | ICD-10-CM | POA: Diagnosis not present

## 2015-08-20 DIAGNOSIS — K1321 Leukoplakia of oral mucosa, including tongue: Secondary | ICD-10-CM | POA: Diagnosis not present

## 2015-08-20 MED FILL — TRIAMCINOLONE 0.1% PASTE: 0.1 | 15 days supply | Qty: 5 | Fill #1

## 2015-09-20 DIAGNOSIS — K1321 Leukoplakia of oral mucosa, including tongue: Secondary | ICD-10-CM | POA: Diagnosis not present

## 2015-10-10 DIAGNOSIS — K1321 Leukoplakia of oral mucosa, including tongue: Secondary | ICD-10-CM | POA: Diagnosis not present

## 2015-10-18 DIAGNOSIS — K1321 Leukoplakia of oral mucosa, including tongue: Secondary | ICD-10-CM | POA: Diagnosis not present

## 2015-11-12 MED FILL — EPINEPHRINE 0.3 MG AUTO-INJ: 0.3 | 30 days supply | Qty: 2 | Fill #1

## 2016-01-01 ENCOUNTER — Other Ambulatory Visit: Payer: Self-pay | Admitting: Obstetrics and Gynecology

## 2016-01-01 DIAGNOSIS — Z1231 Encounter for screening mammogram for malignant neoplasm of breast: Secondary | ICD-10-CM

## 2016-01-10 ENCOUNTER — Ambulatory Visit
Admission: RE | Admit: 2016-01-10 | Discharge: 2016-01-10 | Disposition: A | Discharge status: Home / Self Care | Payer: 59 | Source: Ambulatory Visit | Attending: Obstetrics and Gynecology | Admitting: Obstetrics and Gynecology

## 2016-01-10 DIAGNOSIS — Z1231 Encounter for screening mammogram for malignant neoplasm of breast: Secondary | ICD-10-CM | POA: Diagnosis not present

## 2016-01-16 MED FILL — ESTRACE 0.01% CREAM: 0.1 | 28 days supply | Qty: 43 | Fill #0

## 2016-03-02 DIAGNOSIS — Z91030 Bee allergy status: Secondary | ICD-10-CM | POA: Diagnosis not present

## 2016-03-02 DIAGNOSIS — N952 Postmenopausal atrophic vaginitis: Secondary | ICD-10-CM | POA: Diagnosis not present

## 2016-03-02 DIAGNOSIS — Z0000 Encounter for general adult medical examination without abnormal findings: Secondary | ICD-10-CM | POA: Diagnosis not present

## 2016-03-02 DIAGNOSIS — K219 Gastro-esophageal reflux disease without esophagitis: Secondary | ICD-10-CM | POA: Diagnosis not present

## 2016-03-02 DIAGNOSIS — J45990 Exercise induced bronchospasm: Secondary | ICD-10-CM | POA: Diagnosis not present

## 2016-03-02 DIAGNOSIS — Z1159 Encounter for screening for other viral diseases: Secondary | ICD-10-CM | POA: Diagnosis not present

## 2016-04-30 MED FILL — GAVILYTE-N SOLUTION: 420 | 1 days supply | Qty: 4000 | Fill #0

## 2016-05-04 DIAGNOSIS — K1321 Leukoplakia of oral mucosa, including tongue: Secondary | ICD-10-CM | POA: Diagnosis not present

## 2016-05-11 DIAGNOSIS — Z1211 Encounter for screening for malignant neoplasm of colon: Secondary | ICD-10-CM | POA: Diagnosis not present

## 2016-07-13 DIAGNOSIS — Z6824 Body mass index (BMI) 24.0-24.9, adult: Secondary | ICD-10-CM | POA: Diagnosis not present

## 2016-07-13 DIAGNOSIS — R319 Hematuria, unspecified: Secondary | ICD-10-CM | POA: Diagnosis not present

## 2016-07-13 DIAGNOSIS — N390 Urinary tract infection, site not specified: Secondary | ICD-10-CM | POA: Diagnosis not present

## 2016-07-13 DIAGNOSIS — Z01419 Encounter for gynecological examination (general) (routine) without abnormal findings: Secondary | ICD-10-CM | POA: Diagnosis not present

## 2016-07-20 DIAGNOSIS — S29011A Strain of muscle and tendon of front wall of thorax, initial encounter: Secondary | ICD-10-CM | POA: Diagnosis not present

## 2016-07-20 DIAGNOSIS — R0789 Other chest pain: Secondary | ICD-10-CM | POA: Diagnosis not present

## 2016-09-07 DIAGNOSIS — R311 Benign essential microscopic hematuria: Secondary | ICD-10-CM | POA: Diagnosis not present

## 2016-10-01 DIAGNOSIS — F4322 Adjustment disorder with anxiety: Secondary | ICD-10-CM | POA: Diagnosis not present

## 2016-10-22 DIAGNOSIS — F4322 Adjustment disorder with anxiety: Secondary | ICD-10-CM | POA: Diagnosis not present

## 2016-11-12 DIAGNOSIS — F4322 Adjustment disorder with anxiety: Secondary | ICD-10-CM | POA: Diagnosis not present

## 2016-12-03 DIAGNOSIS — F4322 Adjustment disorder with anxiety: Secondary | ICD-10-CM | POA: Diagnosis not present

## 2016-12-17 DIAGNOSIS — F4322 Adjustment disorder with anxiety: Secondary | ICD-10-CM | POA: Diagnosis not present

## 2016-12-29 ENCOUNTER — Other Ambulatory Visit: Payer: Self-pay | Admitting: Obstetrics & Gynecology

## 2016-12-29 DIAGNOSIS — Z1231 Encounter for screening mammogram for malignant neoplasm of breast: Secondary | ICD-10-CM

## 2017-01-07 DIAGNOSIS — F4322 Adjustment disorder with anxiety: Secondary | ICD-10-CM | POA: Diagnosis not present

## 2017-01-11 MED FILL — acetaZOLAMIDE 125 MG TABS: 125 | 6 days supply | Qty: 12 | Fill #0

## 2017-01-20 ENCOUNTER — Ambulatory Visit
Admission: RE | Admit: 2017-01-20 | Discharge: 2017-01-20 | Disposition: A | Discharge status: Home / Self Care | Payer: 59 | Source: Ambulatory Visit | Attending: Obstetrics & Gynecology | Admitting: Obstetrics & Gynecology

## 2017-01-20 DIAGNOSIS — Z1231 Encounter for screening mammogram for malignant neoplasm of breast: Secondary | ICD-10-CM | POA: Diagnosis not present

## 2017-01-25 DIAGNOSIS — F4322 Adjustment disorder with anxiety: Secondary | ICD-10-CM | POA: Diagnosis not present

## 2017-01-25 DIAGNOSIS — S0086XA Insect bite (nonvenomous) of other part of head, initial encounter: Secondary | ICD-10-CM | POA: Diagnosis not present

## 2017-01-27 DIAGNOSIS — T63481A Toxic effect of venom of other arthropod, accidental (unintentional), initial encounter: Secondary | ICD-10-CM | POA: Diagnosis not present

## 2017-01-27 DIAGNOSIS — S0086XA Insect bite (nonvenomous) of other part of head, initial encounter: Secondary | ICD-10-CM | POA: Diagnosis not present

## 2017-01-27 MED FILL — predniSONE 20 MG TABS: 20 | 5 days supply | Qty: 10 | Fill #0

## 2017-01-27 MED FILL — DOXYCYCLINE HYCLATE 100 MG: 100 | 10 days supply | Qty: 20 | Fill #0

## 2017-02-18 DIAGNOSIS — F4322 Adjustment disorder with anxiety: Secondary | ICD-10-CM | POA: Diagnosis not present

## 2017-03-12 DIAGNOSIS — F4322 Adjustment disorder with anxiety: Secondary | ICD-10-CM | POA: Diagnosis not present

## 2017-04-07 DIAGNOSIS — R079 Chest pain, unspecified: Secondary | ICD-10-CM | POA: Diagnosis not present

## 2017-04-07 DIAGNOSIS — Z0000 Encounter for general adult medical examination without abnormal findings: Secondary | ICD-10-CM | POA: Diagnosis not present

## 2017-04-07 DIAGNOSIS — J45990 Exercise induced bronchospasm: Secondary | ICD-10-CM | POA: Diagnosis not present

## 2017-04-07 DIAGNOSIS — Z91030 Bee allergy status: Secondary | ICD-10-CM | POA: Diagnosis not present

## 2017-04-07 DIAGNOSIS — K219 Gastro-esophageal reflux disease without esophagitis: Secondary | ICD-10-CM | POA: Diagnosis not present

## 2017-04-07 MED FILL — VENTOLIN HFA 90 MCG INHALER: 108 (90 BAS | 17 days supply | Qty: 18 | Fill #0

## 2017-05-28 DIAGNOSIS — Z23 Encounter for immunization: Secondary | ICD-10-CM | POA: Diagnosis not present

## 2017-07-15 DIAGNOSIS — Z23 Encounter for immunization: Secondary | ICD-10-CM | POA: Diagnosis not present

## 2017-07-19 DIAGNOSIS — Z6824 Body mass index (BMI) 24.0-24.9, adult: Secondary | ICD-10-CM | POA: Diagnosis not present

## 2017-07-19 DIAGNOSIS — Z01419 Encounter for gynecological examination (general) (routine) without abnormal findings: Secondary | ICD-10-CM | POA: Diagnosis not present

## 2017-07-19 MED FILL — NYSTATIN-TRIAMCINOLONE CRM: 100000-0.1 | 15 days supply | Qty: 30 | Fill #0

## 2017-11-18 DIAGNOSIS — K1321 Leukoplakia of oral mucosa, including tongue: Secondary | ICD-10-CM | POA: Diagnosis not present

## 2017-11-26 DIAGNOSIS — Z23 Encounter for immunization: Secondary | ICD-10-CM | POA: Diagnosis not present

## 2017-12-10 DIAGNOSIS — L570 Actinic keratosis: Secondary | ICD-10-CM | POA: Diagnosis not present

## 2017-12-21 ENCOUNTER — Other Ambulatory Visit: Payer: Self-pay | Admitting: Family Medicine

## 2017-12-21 ENCOUNTER — Other Ambulatory Visit: Payer: Self-pay | Admitting: Obstetrics & Gynecology

## 2017-12-21 DIAGNOSIS — Z1231 Encounter for screening mammogram for malignant neoplasm of breast: Secondary | ICD-10-CM

## 2018-02-01 ENCOUNTER — Ambulatory Visit
Admission: RE | Admit: 2018-02-01 | Discharge: 2018-02-01 | Disposition: A | Discharge status: Home / Self Care | Payer: Medicare Other | Source: Ambulatory Visit | Attending: Family Medicine | Admitting: Family Medicine

## 2018-02-01 DIAGNOSIS — Z1231 Encounter for screening mammogram for malignant neoplasm of breast: Secondary | ICD-10-CM | POA: Diagnosis not present

## 2018-04-11 DIAGNOSIS — Z23 Encounter for immunization: Secondary | ICD-10-CM | POA: Diagnosis not present

## 2018-04-11 DIAGNOSIS — Z0000 Encounter for general adult medical examination without abnormal findings: Secondary | ICD-10-CM | POA: Diagnosis not present

## 2018-04-13 DIAGNOSIS — Z13220 Encounter for screening for lipoid disorders: Secondary | ICD-10-CM | POA: Diagnosis not present

## 2018-04-13 DIAGNOSIS — Z131 Encounter for screening for diabetes mellitus: Secondary | ICD-10-CM | POA: Diagnosis not present

## 2018-05-20 DIAGNOSIS — N644 Mastodynia: Secondary | ICD-10-CM | POA: Diagnosis not present

## 2018-05-24 ENCOUNTER — Other Ambulatory Visit: Payer: Self-pay | Admitting: Family Medicine

## 2018-05-24 DIAGNOSIS — N644 Mastodynia: Secondary | ICD-10-CM

## 2018-05-31 ENCOUNTER — Ambulatory Visit
Admission: RE | Admit: 2018-05-31 | Discharge: 2018-05-31 | Disposition: A | Discharge status: Home / Self Care | Payer: Medicare Other | Source: Ambulatory Visit | Attending: Family Medicine | Admitting: Family Medicine

## 2018-05-31 ENCOUNTER — Ambulatory Visit: Payer: Medicare Other

## 2018-05-31 DIAGNOSIS — N644 Mastodynia: Secondary | ICD-10-CM

## 2018-05-31 DIAGNOSIS — R922 Inconclusive mammogram: Secondary | ICD-10-CM | POA: Diagnosis not present

## 2018-07-25 DIAGNOSIS — Z01419 Encounter for gynecological examination (general) (routine) without abnormal findings: Secondary | ICD-10-CM | POA: Diagnosis not present

## 2018-07-25 DIAGNOSIS — Z6824 Body mass index (BMI) 24.0-24.9, adult: Secondary | ICD-10-CM | POA: Diagnosis not present

## 2018-09-06 DIAGNOSIS — L570 Actinic keratosis: Secondary | ICD-10-CM | POA: Diagnosis not present

## 2018-09-06 DIAGNOSIS — L821 Other seborrheic keratosis: Secondary | ICD-10-CM | POA: Diagnosis not present

## 2018-09-19 DIAGNOSIS — M545 Low back pain: Secondary | ICD-10-CM | POA: Diagnosis not present

## 2018-12-16 DIAGNOSIS — H43813 Vitreous degeneration, bilateral: Secondary | ICD-10-CM | POA: Diagnosis not present

## 2018-12-16 DIAGNOSIS — H2513 Age-related nuclear cataract, bilateral: Secondary | ICD-10-CM | POA: Diagnosis not present

## 2019-01-10 ENCOUNTER — Other Ambulatory Visit: Payer: Self-pay | Admitting: Family Medicine

## 2019-01-10 DIAGNOSIS — Z1231 Encounter for screening mammogram for malignant neoplasm of breast: Secondary | ICD-10-CM

## 2019-02-21 ENCOUNTER — Ambulatory Visit: Payer: 59

## 2019-04-07 ENCOUNTER — Ambulatory Visit
Admission: RE | Admit: 2019-04-07 | Discharge: 2019-04-07 | Disposition: A | Discharge status: Home / Self Care | Payer: 59 | Source: Ambulatory Visit | Attending: Family Medicine | Admitting: Family Medicine

## 2019-04-07 ENCOUNTER — Other Ambulatory Visit: Payer: Self-pay

## 2019-04-07 DIAGNOSIS — Z1231 Encounter for screening mammogram for malignant neoplasm of breast: Secondary | ICD-10-CM

## 2019-05-18 NOTE — H&P (Signed)
Joan Boyle is an 63 y.o. female.   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  Past Medical History:  Diagnosis Date  . Abnormally small mouth   . Asthma    exercise-induced; prn inhaler  . 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 GERD (gastroesophageal reflux disease)     Past Surgical History:  Procedure Laterality Date  . EXCISION OF TONGUE LESION WITH LASER  2011  . MINOR C02 LASER EXCISION OF ORAL LESION Right 12/11/2013   Procedure: CO2 LASER ABLATION RIGHT SIDE OF TONGUE   (MINOR PROCEDURE) ;  Surgeon: [name], MD;  Location: [facility];  Service: ENT;  Laterality: Right;  . SALIVARY STONE REMOVAL  08/01/2012   Procedure: SALIVARY STONE REMOVAL;  Surgeon: [name], MD;  Location: [facility];  Service: ENT;  Laterality: Bilateral;  FLOOR OF MOUTH EXCISION POSSIBLE SUBMANDIBULAR DUCT RE-ROUTE  . WISDOM TOOTH EXTRACTION      No family history on file. Social History:  reports that she has never smoked. She has never used smokeless tobacco. She reports current alcohol use. She reports that she does not use drugs.  Allergies:  Allergies  Allergen Reactions  . Latex Swelling    SWELLING OF EYES  . Penicillins Rash  . Toradol [Ketorolac Tromethamine] Rash    No medications prior to admission.    No results found for this or any previous visit (from the past 48 hour(s)). No results found.  ROS: otherwise negative  There were no vitals taken for this visit.  PHYSICAL EXAM: Overall appearance:  Healthy appearing, in no distress Head:  Normocephalic, atraumatic. Ears: External auditory canals are clear; tympanic membranes are intact and the middle ears are free of any effusion. Nose: External nose is healthy in appearance. Internal nasal exam free of any lesions or obstruction. Oral Cavity/pharynx:  There are no mucosal lesions or masses identified, except for the right lateral  oral tongue with a small superficial ulcer. Hypopharynx/Larynx: no signs of any mucosal lesions or masses identified. Vocal cords move normally. Neuro:  No identifiable neurologic deficits. Neck: No palpable neck masses.  Studies Reviewed: none    Assessment/Plan Right tongue lesion. Recommend excisional biopsy.  [name] 05/18/2019, 8:12 PM

## 2019-05-22 ENCOUNTER — Other Ambulatory Visit: Payer: Self-pay

## 2019-05-22 ENCOUNTER — Encounter (HOSPITAL_BASED_OUTPATIENT_CLINIC_OR_DEPARTMENT_OTHER): Payer: Self-pay | Admitting: *Deleted

## 2019-05-25 ENCOUNTER — Other Ambulatory Visit (HOSPITAL_COMMUNITY)
Admission: RE | Admit: 2019-05-25 | Discharge: 2019-05-25 | Disposition: A | Discharge status: Home / Self Care | Payer: 59 | Source: Ambulatory Visit | Attending: Otolaryngology | Admitting: Otolaryngology

## 2019-05-25 DIAGNOSIS — Z20828 Contact with and (suspected) exposure to other viral communicable diseases: Secondary | ICD-10-CM | POA: Diagnosis not present

## 2019-05-25 DIAGNOSIS — Z01812 Encounter for preprocedural laboratory examination: Secondary | ICD-10-CM | POA: Diagnosis present

## 2019-05-28 LAB — NOVEL CORONAVIRUS, NAA (HOSP ORDER, SEND-OUT TO REF LAB; TAT 18-24 HRS): SARS-CoV-2, NAA: NOT DETECTED

## 2019-05-28 NOTE — Anesthesia Preprocedure Evaluation (Addendum)
Anesthesia Evaluation  Patient identified by MRN, date of birth, ID band Patient awake    Reviewed: Allergy & Precautions, NPO status , Patient's Chart, lab work & pertinent test results  Airway Mallampati: II  TM Distance: >3 FB Neck ROM: Full    Dental no notable dental hx.    Pulmonary asthma ,    Pulmonary exam normal breath sounds clear to auscultation       Cardiovascular negative cardio ROS Normal cardiovascular exam Rhythm:Regular Rate:Normal     Neuro/Psych negative neurological ROS  negative psych ROS   GI/Hepatic Neg liver ROS, GERD  Medicated and Controlled,  Endo/Other  negative endocrine ROS  Renal/GU negative Renal ROS     Musculoskeletal negative musculoskeletal ROS (+)   Abdominal   Peds  Hematology negative hematology ROS (+)   Anesthesia Other Findings Excision of right tongue lesion w/CO2 laser, biopsy with frozen sect.  MAC anesthia  Reproductive/Obstetrics                            Anesthesia Physical Anesthesia Plan  ASA: II  Anesthesia Plan: MAC   Post-op Pain Management:    Induction: Intravenous  PONV Risk Score and Plan: 2 and Ondansetron, Dexamethasone, Midazolam, Treatment may vary due to age or medical condition and Propofol infusion  Airway Management Planned: Natural Airway  Additional Equipment:   Intra-op Plan:   Post-operative Plan:   Informed Consent: I have reviewed the patients History and Physical, chart, labs and discussed the procedure including the risks, benefits and alternatives for the proposed anesthesia with the patient or authorized representative who has indicated his/her understanding and acceptance.     Dental advisory given  Plan Discussed with: CRNA  Anesthesia Plan Comments:       Anesthesia Quick Evaluation

## 2019-05-29 ENCOUNTER — Encounter (HOSPITAL_BASED_OUTPATIENT_CLINIC_OR_DEPARTMENT_OTHER): Payer: Self-pay | Admitting: *Deleted

## 2019-05-29 ENCOUNTER — Ambulatory Visit (HOSPITAL_BASED_OUTPATIENT_CLINIC_OR_DEPARTMENT_OTHER): Payer: 59 | Admitting: Anesthesiology

## 2019-05-29 ENCOUNTER — Encounter (HOSPITAL_BASED_OUTPATIENT_CLINIC_OR_DEPARTMENT_OTHER)
Admission: RE | Disposition: A | Discharge status: Home / Self Care | Payer: Self-pay | Source: Home / Self Care | Attending: Otolaryngology

## 2019-05-29 ENCOUNTER — Other Ambulatory Visit: Payer: Self-pay

## 2019-05-29 ENCOUNTER — Ambulatory Visit (HOSPITAL_BASED_OUTPATIENT_CLINIC_OR_DEPARTMENT_OTHER)
Admission: RE | Admit: 2019-05-29 | Discharge: 2019-05-29 | Disposition: A | Discharge status: Home / Self Care | Payer: 59 | Attending: Otolaryngology | Admitting: Otolaryngology

## 2019-05-29 DIAGNOSIS — Z85810 Personal history of malignant neoplasm of tongue: Secondary | ICD-10-CM | POA: Diagnosis not present

## 2019-05-29 DIAGNOSIS — K219 Gastro-esophageal reflux disease without esophagitis: Secondary | ICD-10-CM | POA: Diagnosis not present

## 2019-05-29 DIAGNOSIS — C021 Malignant neoplasm of border of tongue: Secondary | ICD-10-CM | POA: Insufficient documentation

## 2019-05-29 DIAGNOSIS — K149 Disease of tongue, unspecified: Secondary | ICD-10-CM | POA: Diagnosis present

## 2019-05-29 DIAGNOSIS — J45990 Exercise induced bronchospasm: Secondary | ICD-10-CM | POA: Insufficient documentation

## 2019-05-29 HISTORY — PX: EXCISION OF TONGUE LESION WITH LASER: SHX5823

## 2019-05-29 SURGERY — EXCISION, LESION, TONGUE, USING LASER
Anesthesia: Monitor Anesthesia Care | Site: Mouth | Laterality: Right

## 2019-05-29 MED ORDER — PROPOFOL 500 MG/50ML IV EMUL
INTRAVENOUS | Status: DC | PRN
  Administered 2019-05-29: 25 ug/kg/min via INTRAVENOUS

## 2019-05-29 MED ORDER — BACITRACIN ZINC 500 UNIT/GM EX OINT
TOPICAL_OINTMENT | CUTANEOUS | Status: AC
  Filled 2019-05-29: qty 28.35

## 2019-05-29 MED ORDER — ONDANSETRON HCL 4 MG/2ML IJ SOLN
INTRAMUSCULAR | Status: AC
  Filled 2019-05-29: qty 2

## 2019-05-29 MED ORDER — HYDROMORPHONE HCL 1 MG/ML IJ SOLN: 0.2500 mg | INTRAMUSCULAR | Status: DC | PRN

## 2019-05-29 MED ORDER — ACETAMINOPHEN 500 MG PO TABS
1000.0000 mg | ORAL_TABLET | Freq: Once | ORAL | Status: AC
  Administered 2019-05-29: 1000 mg via ORAL

## 2019-05-29 MED ORDER — HYDROCODONE-ACETAMINOPHEN 7.5-325 MG PO TABS: 1.0000 | ORAL_TABLET | Freq: Four times a day (QID) | ORAL | 0 refills | Status: DC | PRN

## 2019-05-29 MED ORDER — MIDAZOLAM HCL 2 MG/2ML IJ SOLN
INTRAMUSCULAR | Status: AC
  Filled 2019-05-29: qty 2

## 2019-05-29 MED ORDER — ACETAMINOPHEN 500 MG PO TABS
ORAL_TABLET | ORAL | Status: AC
  Filled 2019-05-29: qty 2

## 2019-05-29 MED ORDER — FENTANYL CITRATE (PF) 100 MCG/2ML IJ SOLN
50.0000 ug | INTRAMUSCULAR | Status: DC | PRN
  Administered 2019-05-29 (×2): 50 ug via INTRAVENOUS

## 2019-05-29 MED ORDER — OXYMETAZOLINE HCL 0.05 % NA SOLN
NASAL | Status: AC
  Filled 2019-05-29: qty 30

## 2019-05-29 MED ORDER — LIDOCAINE-EPINEPHRINE 1 %-1:100000 IJ SOLN
INTRAMUSCULAR | Status: DC | PRN
  Administered 2019-05-29: 5 mL

## 2019-05-29 MED ORDER — MIDAZOLAM HCL 2 MG/2ML IJ SOLN
1.0000 mg | INTRAMUSCULAR | Status: DC | PRN
  Administered 2019-05-29 (×2): 1 mg via INTRAVENOUS

## 2019-05-29 MED ORDER — OXYCODONE HCL 5 MG PO TABS: 5.0000 mg | ORAL_TABLET | Freq: Once | ORAL | Status: DC | PRN

## 2019-05-29 MED ORDER — PROPOFOL 10 MG/ML IV BOLUS
INTRAVENOUS | Status: DC | PRN
  Administered 2019-05-29 (×5): 10 mg via INTRAVENOUS

## 2019-05-29 MED ORDER — ONDANSETRON HCL 4 MG/2ML IJ SOLN
INTRAMUSCULAR | Status: DC | PRN
  Administered 2019-05-29: 4 mg via INTRAVENOUS

## 2019-05-29 MED ORDER — LACTATED RINGERS IV SOLN
INTRAVENOUS | Status: DC
  Administered 2019-05-29: 07:00:00 via INTRAVENOUS

## 2019-05-29 MED ORDER — PROPOFOL 500 MG/50ML IV EMUL
INTRAVENOUS | Status: AC
  Filled 2019-05-29: qty 50

## 2019-05-29 MED ORDER — FENTANYL CITRATE (PF) 100 MCG/2ML IJ SOLN
INTRAMUSCULAR | Status: AC
  Filled 2019-05-29: qty 2

## 2019-05-29 MED ORDER — OXYCODONE HCL 5 MG/5ML PO SOLN: 5.0000 mg | Freq: Once | ORAL | Status: DC | PRN

## 2019-05-29 MED ORDER — PROMETHAZINE HCL 25 MG RE SUPP: 25.0000 mg | Freq: Four times a day (QID) | RECTAL | 1 refills | Status: DC | PRN

## 2019-05-29 MED ORDER — CLINDAMYCIN HCL 300 MG PO CAPS: 300.0000 mg | ORAL_CAPSULE | Freq: Three times a day (TID) | ORAL | 0 refills | Status: DC

## 2019-05-29 MED ORDER — LIDOCAINE VISCOUS HCL 2 % MT SOLN: 15.0000 mL | OROMUCOSAL | 0 refills | Status: DC | PRN

## 2019-05-29 MED ORDER — PROMETHAZINE HCL 25 MG/ML IJ SOLN: 6.2500 mg | INTRAMUSCULAR | Status: DC | PRN

## 2019-05-29 MED ORDER — EPINEPHRINE PF 1 MG/ML IJ SOLN
INTRAMUSCULAR | Status: AC
  Filled 2019-05-29: qty 1

## 2019-05-29 MED ORDER — LIDOCAINE-EPINEPHRINE 1 %-1:100000 IJ SOLN
INTRAMUSCULAR | Status: AC
  Filled 2019-05-29: qty 1

## 2019-05-29 SURGICAL SUPPLY — 41 items
BNDG EYE OVAL (GAUZE/BANDAGES/DRESSINGS) ×4 IMPLANT
CANISTER SUCT 1200ML W/VALVE (MISCELLANEOUS) ×2 IMPLANT
COAGULATOR SUCT SWTCH 10FR 6 (ELECTROSURGICAL) IMPLANT
COVER MAYO STAND REUSABLE (DRAPES) ×2 IMPLANT
COVER WAND RF STERILE (DRAPES) IMPLANT
DEPRESSOR TONGUE BLADE STERILE (MISCELLANEOUS) ×4 IMPLANT
DRAPE HALF SHEET 70X43 (DRAPES) ×2 IMPLANT
ELECT COATED BLADE 2.86 ST (ELECTRODE) IMPLANT
ELECT REM PT RETURN 9FT ADLT (ELECTROSURGICAL)
ELECTRODE REM PT RTRN 9FT ADLT (ELECTROSURGICAL) IMPLANT
FILTER 7/8 IN (FILTER) ×2 IMPLANT
GAUZE SPONGE 4X4 12PLY STRL LF (GAUZE/BANDAGES/DRESSINGS) IMPLANT
GLOVE ECLIPSE 7.5 STRL STRAW (GLOVE) IMPLANT
GLOVE SURG SS PI 7.0 STRL IVOR (GLOVE) ×2 IMPLANT
GLOVE SURG SS PI 7.5 STRL IVOR (GLOVE) ×2 IMPLANT
GOWN STRL REUS W/ TWL LRG LVL3 (GOWN DISPOSABLE) ×1 IMPLANT
GOWN STRL REUS W/ TWL XL LVL3 (GOWN DISPOSABLE) ×1 IMPLANT
GOWN STRL REUS W/TWL LRG LVL3 (GOWN DISPOSABLE) ×1
GOWN STRL REUS W/TWL XL LVL3 (GOWN DISPOSABLE) ×1
MARKER SKIN DUAL TIP RULER LAB (MISCELLANEOUS) IMPLANT
NEEDLE HYPO 30GX1 BEV (NEEDLE) IMPLANT
NEEDLE PRECISIONGLIDE 27X1.5 (NEEDLE) ×2 IMPLANT
NS IRRIG 1000ML POUR BTL (IV SOLUTION) ×2 IMPLANT
PACK BASIN DAY SURGERY FS (CUSTOM PROCEDURE TRAY) ×2 IMPLANT
PATTIES SURGICAL .5 X3 (DISPOSABLE) IMPLANT
PENCIL FOOT CONTROL (ELECTRODE) IMPLANT
REDUCTION FITTING 1/4 IN (FILTER) ×2 IMPLANT
SLEEVE SCD COMPRESS KNEE MED (MISCELLANEOUS) IMPLANT
SUCTION FRAZIER HANDLE 10FR (MISCELLANEOUS) ×1
SUCTION TUBE FRAZIER 10FR DISP (MISCELLANEOUS) ×1 IMPLANT
SUT SILK 3 0 PS 1 (SUTURE) ×2 IMPLANT
SUT SILK 3 0 SH 30 (SUTURE) ×2 IMPLANT
SUT VIC AB 3-0 SH 27 (SUTURE) ×1
SUT VIC AB 3-0 SH 27X BRD (SUTURE) ×1 IMPLANT
SUT VIC AB 4-0 P-3 18XBRD (SUTURE) ×1 IMPLANT
SUT VIC AB 4-0 P3 18 (SUTURE) ×1
SUT VICRYL 4-0 PS2 18IN ABS (SUTURE) ×2 IMPLANT
SYR BULB 3OZ (MISCELLANEOUS) IMPLANT
SYR CONTROL 10ML LL (SYRINGE) ×2 IMPLANT
TOWEL GREEN STERILE FF (TOWEL DISPOSABLE) ×2 IMPLANT
TUBE CONNECTING 20X1/4 (TUBING) ×4 IMPLANT

## 2019-05-29 NOTE — Discharge Instructions (Signed)
Rinse mouth with saline several times daily.     Post Anesthesia Home Care Instructions  Activity: Get plenty of rest for the remainder of the day. A responsible individual must stay with you for 24 hours following the procedure.  For the next 24 hours, DO NOT: -Drive a car -Paediatric nurse -Drink alcoholic beverages -Take any medication unless instructed by your physician -Make any legal decisions or sign important papers.  Meals: Start with liquid foods such as gelatin or soup. Progress to regular foods as tolerated. Avoid greasy, spicy, heavy foods. If nausea and/or vomiting occur, drink only clear liquids until the nausea and/or vomiting subsides. Call your physician if vomiting continues.  Special Instructions/Symptoms: Your throat may feel dry or sore from the anesthesia or the breathing tube placed in your throat during surgery. If this causes discomfort, gargle with warm salt water. The discomfort should disappear within 24 hours.  If you had a scopolamine patch placed behind your ear for the management of post- operative nausea and/or vomiting:  1. The medication in the patch is effective for 72 hours, after which it should be removed.  Wrap patch in a tissue and discard in the trash. Wash hands thoroughly with soap and water. 2. You may remove the patch earlier than 72 hours if you experience unpleasant side effects which may include dry mouth, dizziness or visual disturbances. 3. Avoid touching the patch. Wash your hands with soap and water after contact with the patch.    Call your surgeon if you experience:   1.  Fever over 101.0. 2.  Inability to urinate. 3.  Nausea and/or vomiting. 4.  Extreme swelling or bruising at the surgical site. 5.  Continued bleeding from the incision. 6.  Increased pain, redness or drainage from the incision. 7.  Problems related to your pain medication. 8.  Any problems and/or concerns

## 2019-05-29 NOTE — Transfer of Care (Signed)
Immediate Anesthesia Transfer of Care Note  Patient: MELVINA PANGELINAN  Procedure(s) Performed: Excision Right Tongue Lesion w/CO2 laser, Frozen section (Right Mouth)  Patient Location: PACU  Anesthesia Type:MAC  Level of Consciousness: awake, alert  and oriented  Airway & Oxygen Therapy: Patient Spontanous Breathing  Post-op Assessment: Report given to RN and Post -op Vital signs reviewed and stable  Post vital signs: Reviewed and stable  Last Vitals:  Vitals Value Taken Time  BP    Temp    Pulse    Resp    SpO2      Last Pain:  Vitals:   05/29/19 0635  TempSrc: Oral  PainSc: 0-No pain         Complications: No apparent anesthesia complications

## 2019-05-29 NOTE — Op Note (Signed)
OPERATIVE REPORT  DATE OF SURGERY: 05/29/2019  PATIENT:  Joan Boyle,  63 y.o. female  PRE-OPERATIVE DIAGNOSIS:  Excision of right tongue lesion w/CO2 laser, biopsy with frozen sect.  MAC anesthesia  POST-OPERATIVE DIAGNOSIS:  Excision of right tongue lesion w/CO2 laser, biopsy with frozen sect.  MAC anesthesia  PROCEDURE:  Procedure(s): Excision Right Tongue Lesion w/CO2 laser, Frozen section  SURGEON:  Beckie Salts, MD  ASSISTANTS: None  ANESTHESIA:   General   EBL: 20 ml  DRAINS: None  LOCAL MEDICATIONS USED: 1% Xylocaine with epinephrine  SPECIMEN: 1.  Right lateral oral tongue lesion, excision, sutures marked anterior and superior margins.  Frozen section, consistent with squamous cell carcinoma, superior margin positive, deep margin close. 2.  New superior margin, suture marks anterior and superior margin.  For permanent pathology. 3.  New deep margin.  COUNTS:  Correct  PROCEDURE DETAILS: The patient was taken to the operating room and placed on the operating table in the supine position. Following induction of intravenous sedation and local anesthesia, the face was draped in the standard fashion.  Saline soaked towels were used for laser protection.  A bite block was used on the left side.  The lesion was isolated on the right side of the lateral tongue.  The CO2 laser was used in the setting of 4 W continuous power outlined the mucosal incisions.  Laser was used to perform the complete excision.  After the frozen sections were obtained additional superior and deep margins were obtained.  A total of approximately 4 x 2 cm of mucosa was removed.  The defect was reapproximated with inverted interrupted 3-0 Vicryl sutures.  She tolerated this well.  Patient was transferred to recovery in stable condition    PATIENT DISPOSITION:  To PACU, stable

## 2019-05-29 NOTE — Interval H&P Note (Signed)
History and Physical Interval Note:  05/29/2019 7:22 AM  Joan Boyle  has presented today for surgery, with the diagnosis of Excision of right tongue lesion w/CO2 laser, biopsy with frozen sect.  MAC anesthia.  The various methods of treatment have been discussed with the patient and family. After consideration of risks, benefits and other options for treatment, the patient has consented to  Procedure(s): Excision Right Tongue Lesion w/CO2 laser, Frozen section (Right) as a surgical intervention.  The patient's history has been reviewed, patient examined, no change in status, stable for surgery.  I have reviewed the patient's chart and labs.  Questions were answered to the patient's satisfaction.     [name]

## 2019-05-29 NOTE — Anesthesia Postprocedure Evaluation (Signed)
Anesthesia Post Note  Patient: Joan Boyle  Procedure(s) Performed: Excision Right Tongue Lesion w/CO2 laser, Frozen section (Right Mouth)     Patient location during evaluation: PACU Anesthesia Type: MAC Level of consciousness: awake and alert Pain management: pain level controlled Vital Signs Assessment: post-procedure vital signs reviewed and stable Respiratory status: spontaneous breathing, nonlabored ventilation, respiratory function stable and patient connected to nasal cannula oxygen Cardiovascular status: stable and blood pressure returned to baseline Postop Assessment: no apparent nausea or vomiting Anesthetic complications: no    Last Vitals:  Vitals:   05/29/19 0635 05/29/19 0923  BP: 126/65 133/69  Pulse: 80   Resp: 16 18  Temp: 36.5 C 37 C  SpO2: 100% 99%    Last Pain:  Vitals:   05/29/19 0940  TempSrc:   PainSc: 2                  Kartik Fernando P Tajon Moring

## 2019-05-30 ENCOUNTER — Encounter (HOSPITAL_BASED_OUTPATIENT_CLINIC_OR_DEPARTMENT_OTHER): Payer: Self-pay | Admitting: Otolaryngology

## 2019-06-13 NOTE — H&P (Signed)
Joan Boyle is an 63 y.o. female.   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  Here for wide resection.  Past Medical History:  Diagnosis Date  . Abnormally small mouth   . Asthma    exercise-induced; prn inhaler  . 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 GERD (gastroesophageal reflux disease)     Past Surgical History:  Procedure Laterality Date  . EXCISION OF TONGUE LESION WITH LASER  2011  . EXCISION OF TONGUE LESION WITH LASER Right 05/29/2019   Procedure: Excision Right Tongue Lesion w/CO2 laser, Frozen section;  Surgeon: [name], MD;  Location: [facility];  Service: ENT;  Laterality: Right;  . MINOR C02 LASER EXCISION OF ORAL LESION Right 12/11/2013   Procedure: CO2 LASER ABLATION RIGHT SIDE OF TONGUE   (MINOR PROCEDURE) ;  Surgeon: [name], MD;  Location: [facility];  Service: ENT;  Laterality: Right;  . SALIVARY STONE REMOVAL  08/01/2012   Procedure: SALIVARY STONE REMOVAL;  Surgeon: [name], MD;  Location: [facility];  Service: ENT;  Laterality: Bilateral;  FLOOR OF MOUTH EXCISION POSSIBLE SUBMANDIBULAR DUCT RE-ROUTE  . WISDOM TOOTH EXTRACTION      No family history on file. Social History:  reports that she has never smoked. She has never used smokeless tobacco. She reports current alcohol use. She reports that she does not use drugs.  Allergies:  Allergies  Allergen Reactions  . Latex Swelling    SWELLING OF EYES  . Penicillins Rash    Did it involve swelling of the face/tongue/throat, SOB, or low BP? YES Did it involve sudden or severe rash/hives, skin peeling, or any reaction on the inside of your mouth or nose? Yes Did you need to seek medical attention at a hospital or doctor's office? Yes When did it last happen?childhood If all above answers are "NO", may proceed with cephalosporin use.   . Toradol [Ketorolac Tromethamine]  Rash    No medications prior to admission.    No results found for this or any previous visit (from the past 48 hour(s)). No results found.  ROS: otherwise negative  There were no vitals taken for this visit.  PHYSICAL EXAM: Overall appearance:  Healthy appearing, in no distress Head:  Normocephalic, atraumatic. Ears: External auditory canals are clear; tympanic membranes are intact and the middle ears are free of any effusion. Nose: External nose is healthy in appearance. Internal nasal exam free of any lesions or obstruction. Oral Cavity/pharynx: Right lateral tongue biopsy site healing. Hypopharynx/Larynx: no signs of any mucosal lesions or masses identified. Vocal cords move normally. Neuro:  No identifiable neurologic deficits. Neck: No palpable neck masses.  Studies Reviewed: none    Assessment/Plan Right lateral oral tongue cancer.  Here for partial glossectomy.  [name] 06/13/2019, 12:33 PM

## 2019-06-16 NOTE — Progress Notes (Signed)
[facility], [place] [address] Phone: [phone] Fax: [phone]      Your procedure is scheduled on June 21, 2019.  Report to [facility] Main Entrance "A" at 6:30 A.M., and check in at the Admitting office.  Call this number if you have problems the morning of surgery:  [phone]  Call [phone] if you have any questions prior to your surgery date Monday-Friday 8am-4pm    Remember:  Do not eat or drink after midnight the night before your surgery    Take these medicines the morning of surgery with A SIP OF WATER: loratadine (CLARITIN)  albuterol (PROVENTIL HFA;VENTOLIN HFA) - if needed EPINEPHrine (EPIPEN) - if needed  As of today, STOP taking any Aspirin (unless otherwise instructed by your surgeon), Aleve, Naproxen, Ibuprofen, Motrin, Advil, Goody's, BC's, all herbal medications, fish oil, and all vitamins.    The Morning of Surgery  Do not wear jewelry, make-up or nail polish.  Do not wear lotions, powders, or perfumes or deodorant  Do not shave 48 hours prior to surgery.    Do not bring valuables to the hospital.  [facility] is not responsible for any belongings or valuables.  If you are a smoker, DO NOT Smoke 24 hours prior to surgery IF you wear a CPAP at night please bring your mask, tubing, and machine the morning of surgery   Remember that you must have someone to transport you home after your surgery, and remain with you for 24 hours if you are discharged the same day.   Contacts, glasses, hearing aids, dentures or bridgework may not be worn into surgery.    Leave your suitcase in the car.  After surgery it may be brought to your room.  For patients admitted to the hospital, discharge time will be determined by your treatment team.  Patients discharged the day of surgery will not be allowed to drive home.    Special instructions:   [facility]- Preparing  For Surgery  Before surgery, you can play an important role. Because skin is not sterile, your skin needs to be as free of germs as possible. You can reduce the number of germs on your skin by washing with CHG (chlorahexidine gluconate) Soap before surgery.  CHG is an antiseptic cleaner which kills germs and bonds with the skin to continue killing germs even after washing.    Oral Hygiene is also important to reduce your risk of infection.  Remember - BRUSH YOUR TEETH THE MORNING OF SURGERY WITH YOUR REGULAR TOOTHPASTE  Please do not use if you have an allergy to CHG or antibacterial soaps. If your skin becomes reddened/irritated stop using the CHG.  Do not shave (including legs and underarms) for at least 48 hours prior to first CHG shower. It is OK to shave your face.  Please follow these instructions carefully.   1. Shower the NIGHT BEFORE SURGERY and the MORNING OF SURGERY with CHG Soap.   2. If you chose to wash your hair, wash your hair first as usual with your normal shampoo.  3. After you shampoo, rinse your hair and body thoroughly to remove the shampoo.  4. Use CHG as you would any other liquid soap. You can apply CHG directly to the skin and wash gently with a scrungie or a clean washcloth.   5. Apply the CHG Soap to your body ONLY FROM THE NECK DOWN.  Do not  use on open wounds or open sores. Avoid contact with your eyes, ears, mouth and genitals (private parts). Wash Face and genitals (private parts)  with your normal soap.   6. Wash thoroughly, paying special attention to the area where your surgery will be performed.  7. Thoroughly rinse your body with warm water from the neck down.  8. DO NOT shower/wash with your normal soap after using and rinsing off the CHG Soap.  9. Pat yourself dry with a CLEAN TOWEL.  10. Wear CLEAN PAJAMAS to bed the night before surgery, wear comfortable clothes the morning of surgery  11. Place CLEAN SHEETS on your bed the night of your first  shower and DO NOT SLEEP WITH PETS.    Day of Surgery:  Do not apply any deodorants/lotions. Please shower the morning of surgery with the CHG soap  Please wear clean clothes to the hospital/surgery center.   Remember to brush your teeth WITH YOUR REGULAR TOOTHPASTE.   Please read over the following fact sheets that you were given.

## 2019-06-19 ENCOUNTER — Other Ambulatory Visit (HOSPITAL_COMMUNITY)
Admission: RE | Admit: 2019-06-19 | Discharge: 2019-06-19 | Disposition: A | Discharge status: Home / Self Care | Payer: 59 | Source: Ambulatory Visit | Attending: Otolaryngology | Admitting: Otolaryngology

## 2019-06-19 ENCOUNTER — Encounter (HOSPITAL_COMMUNITY)
Admission: RE | Admit: 2019-06-19 | Discharge: 2019-06-19 | Disposition: A | Discharge status: Home / Self Care | Payer: 59 | Source: Ambulatory Visit | Attending: Otolaryngology | Admitting: Otolaryngology

## 2019-06-19 ENCOUNTER — Encounter (HOSPITAL_COMMUNITY): Payer: Self-pay

## 2019-06-19 ENCOUNTER — Other Ambulatory Visit: Payer: Self-pay

## 2019-06-19 DIAGNOSIS — Z01812 Encounter for preprocedural laboratory examination: Secondary | ICD-10-CM | POA: Insufficient documentation

## 2019-06-19 LAB — CBC
HCT: 42.9 % (ref 36.0–46.0)
Hemoglobin: 13.8 g/dL (ref 12.0–15.0)
MCH: 30.8 pg (ref 26.0–34.0)
MCHC: 32.2 g/dL (ref 30.0–36.0)
MCV: 95.8 fL (ref 80.0–100.0)
Platelets: 187 10*3/uL (ref 150–400)
RBC: 4.48 MIL/uL (ref 3.87–5.11)
RDW: 12.5 % (ref 11.5–15.5)
WBC: 4.5 10*3/uL (ref 4.0–10.5)
nRBC: 0 % (ref 0.0–0.2)

## 2019-06-19 LAB — SARS CORONAVIRUS 2 (TAT 6-24 HRS): SARS Coronavirus 2: NEGATIVE

## 2019-06-19 NOTE — Progress Notes (Signed)
PCP - Dr. Kathyrn Lass Cardiologist - patient denies  PPM/ICD - n/a Device Orders -  Rep Notified -   Chest x-ray - n/a EKG - n/a Stress Test - patient denies ECHO - patient denies Cardiac Cath - patient denies  Sleep Study - patient denies CPAP -   Fasting Blood Sugar - n/a Checks Blood Sugar _____ times a day  Blood Thinner Instructions: n/a Aspirin Instructions:  ERAS Protcol - n/a PRE-SURGERY Ensure or G2-   COVID TEST- scheduled after PAT appointment after    Anesthesia review: n/a  Patient denies shortness of breath, fever, cough and chest pain at PAT appointment   All instructions explained to the patient, with a verbal understanding of the material. Patient agrees to go over the instructions while at home for a better understanding. Patient also instructed to self quarantine after being tested for COVID-19. The opportunity to ask questions was provided.

## 2019-06-21 ENCOUNTER — Encounter (HOSPITAL_COMMUNITY): Payer: Self-pay | Admitting: Anesthesiology

## 2019-06-21 ENCOUNTER — Inpatient Hospital Stay (HOSPITAL_COMMUNITY)
Admission: RE | Admit: 2019-06-21 | Discharge: 2019-06-22 | DRG: 138 | Disposition: A | Discharge status: Home / Self Care | Payer: 59 | Attending: Otolaryngology | Admitting: Otolaryngology

## 2019-06-21 ENCOUNTER — Encounter (HOSPITAL_COMMUNITY)
Admission: RE | Disposition: A | Discharge status: Home / Self Care | Payer: Self-pay | Source: Home / Self Care | Attending: Otolaryngology

## 2019-06-21 ENCOUNTER — Inpatient Hospital Stay (HOSPITAL_COMMUNITY): Payer: 59 | Admitting: Anesthesiology

## 2019-06-21 ENCOUNTER — Other Ambulatory Visit: Payer: Self-pay

## 2019-06-21 DIAGNOSIS — J45909 Unspecified asthma, uncomplicated: Secondary | ICD-10-CM | POA: Diagnosis present

## 2019-06-21 DIAGNOSIS — K219 Gastro-esophageal reflux disease without esophagitis: Secondary | ICD-10-CM | POA: Diagnosis present

## 2019-06-21 DIAGNOSIS — Z85819 Personal history of malignant neoplasm of unspecified site of lip, oral cavity, and pharynx: Secondary | ICD-10-CM

## 2019-06-21 DIAGNOSIS — C029 Malignant neoplasm of tongue, unspecified: Secondary | ICD-10-CM | POA: Diagnosis present

## 2019-06-21 DIAGNOSIS — Z20828 Contact with and (suspected) exposure to other viral communicable diseases: Secondary | ICD-10-CM | POA: Diagnosis present

## 2019-06-21 HISTORY — PX: GLOSSECTOMY: SHX5200

## 2019-06-21 SURGERY — GLOSSECTOMY
Anesthesia: General | Site: Mouth | Laterality: Right

## 2019-06-21 MED ORDER — FENTANYL CITRATE (PF) 100 MCG/2ML IJ SOLN: 25.0000 ug | INTRAMUSCULAR | Status: DC | PRN

## 2019-06-21 MED ORDER — IBUPROFEN 100 MG/5ML PO SUSP
400.0000 mg | Freq: Four times a day (QID) | ORAL | Status: DC | PRN
  Administered 2019-06-21 – 2019-06-22 (×2): 400 mg via ORAL
  Filled 2019-06-21 (×2): qty 20

## 2019-06-21 MED ORDER — HYDROCODONE-ACETAMINOPHEN 7.5-325 MG/15ML PO SOLN
10.0000 mL | ORAL | Status: DC | PRN
  Administered 2019-06-21: 15:00:00 10 mL via ORAL
  Administered 2019-06-21: 15 mL via ORAL
  Filled 2019-06-21 (×2): qty 15

## 2019-06-21 MED ORDER — CLINDAMYCIN PALMITATE HCL 75 MG/5ML PO SOLR
300.0000 mg | Freq: Three times a day (TID) | ORAL | Status: DC
  Administered 2019-06-21 – 2019-06-22 (×3): 300 mg via ORAL
  Filled 2019-06-21 (×4): qty 20

## 2019-06-21 MED ORDER — LACTATED RINGERS IV SOLN
INTRAVENOUS | Status: DC | PRN
  Administered 2019-06-21 (×2): via INTRAVENOUS

## 2019-06-21 MED ORDER — HYDROCODONE-ACETAMINOPHEN 7.5-325 MG PO TABS: 1.0000 | ORAL_TABLET | Freq: Four times a day (QID) | ORAL | 0 refills | Status: DC | PRN

## 2019-06-21 MED ORDER — EPINEPHRINE 0.3 MG/0.3ML IJ SOAJ: 0.3000 mg | Freq: Once | INTRAMUSCULAR | Status: DC

## 2019-06-21 MED ORDER — MIDAZOLAM HCL 5 MG/5ML IJ SOLN
INTRAMUSCULAR | Status: DC | PRN
  Administered 2019-06-21: 2 mg via INTRAVENOUS

## 2019-06-21 MED ORDER — OXYCODONE HCL 5 MG/5ML PO SOLN: 5.0000 mg | Freq: Once | ORAL | Status: DC | PRN

## 2019-06-21 MED ORDER — DEXAMETHASONE SODIUM PHOSPHATE 4 MG/ML IJ SOLN
INTRAMUSCULAR | Status: DC | PRN
  Administered 2019-06-21: 10 mg via INTRAVENOUS

## 2019-06-21 MED ORDER — PROPOFOL 10 MG/ML IV BOLUS
INTRAVENOUS | Status: AC
  Filled 2019-06-21: qty 40

## 2019-06-21 MED ORDER — PROPOFOL 10 MG/ML IV BOLUS
INTRAVENOUS | Status: DC | PRN
  Administered 2019-06-21: 130 mg via INTRAVENOUS

## 2019-06-21 MED ORDER — OXYCODONE HCL 5 MG PO TABS: 5.0000 mg | ORAL_TABLET | Freq: Once | ORAL | Status: DC | PRN

## 2019-06-21 MED ORDER — PHENYLEPHRINE 40 MCG/ML (10ML) SYRINGE FOR IV PUSH (FOR BLOOD PRESSURE SUPPORT)
PREFILLED_SYRINGE | INTRAVENOUS | Status: AC
  Filled 2019-06-21: qty 10

## 2019-06-21 MED ORDER — ONDANSETRON HCL 4 MG/2ML IJ SOLN: 4.0000 mg | Freq: Four times a day (QID) | INTRAMUSCULAR | Status: DC | PRN

## 2019-06-21 MED ORDER — MIDAZOLAM HCL 2 MG/2ML IJ SOLN
INTRAMUSCULAR | Status: AC
  Filled 2019-06-21: qty 2

## 2019-06-21 MED ORDER — PROMETHAZINE HCL 25 MG RE SUPP: 25.0000 mg | Freq: Four times a day (QID) | RECTAL | 1 refills | Status: DC | PRN

## 2019-06-21 MED ORDER — SUGAMMADEX SODIUM 200 MG/2ML IV SOLN
INTRAVENOUS | Status: DC | PRN
  Administered 2019-06-21: 150 mg via INTRAVENOUS

## 2019-06-21 MED ORDER — ROCURONIUM BROMIDE 10 MG/ML (PF) SYRINGE
PREFILLED_SYRINGE | INTRAVENOUS | Status: DC | PRN
  Administered 2019-06-21: 50 mg via INTRAVENOUS

## 2019-06-21 MED ORDER — DEXTROSE-NACL 5-0.9 % IV SOLN
INTRAVENOUS | Status: DC
  Administered 2019-06-21: 11:00:00 via INTRAVENOUS

## 2019-06-21 MED ORDER — LIDOCAINE-EPINEPHRINE 1 %-1:100000 IJ SOLN
INTRAMUSCULAR | Status: AC
  Filled 2019-06-21: qty 1

## 2019-06-21 MED ORDER — FAMOTIDINE 20 MG PO TABS
20.0000 mg | ORAL_TABLET | Freq: Every day | ORAL | Status: DC
  Administered 2019-06-21: 20 mg via ORAL
  Filled 2019-06-21: qty 1

## 2019-06-21 MED ORDER — CLINDAMYCIN HCL 300 MG PO CAPS: 300.0000 mg | ORAL_CAPSULE | Freq: Three times a day (TID) | ORAL | 0 refills | Status: DC

## 2019-06-21 MED ORDER — LIDOCAINE 2% (20 MG/ML) 5 ML SYRINGE
INTRAMUSCULAR | Status: AC
  Filled 2019-06-21: qty 5

## 2019-06-21 MED ORDER — VITAMIN D 25 MCG (1000 UNIT) PO TABS
1000.0000 [IU] | ORAL_TABLET | Freq: Every day | ORAL | Status: DC
  Filled 2019-06-21 (×3): qty 1

## 2019-06-21 MED ORDER — 0.9 % SODIUM CHLORIDE (POUR BTL) OPTIME
TOPICAL | Status: DC | PRN
  Administered 2019-06-21: 09:00:00 1000 mL

## 2019-06-21 MED ORDER — COENZYME Q10 150 MG PO CAPS: 200.0000 mg | ORAL_CAPSULE | Freq: Every day | ORAL | Status: DC

## 2019-06-21 MED ORDER — ALBUTEROL SULFATE HFA 108 (90 BASE) MCG/ACT IN AERS: 1.0000 | INHALATION_SPRAY | Freq: Four times a day (QID) | RESPIRATORY_TRACT | Status: DC | PRN

## 2019-06-21 MED ORDER — ONDANSETRON HCL 4 MG/2ML IJ SOLN
INTRAMUSCULAR | Status: AC
  Filled 2019-06-21: qty 2

## 2019-06-21 MED ORDER — ROCURONIUM BROMIDE 10 MG/ML (PF) SYRINGE
PREFILLED_SYRINGE | INTRAVENOUS | Status: AC
  Filled 2019-06-21: qty 10

## 2019-06-21 MED ORDER — PHENYLEPHRINE 40 MCG/ML (10ML) SYRINGE FOR IV PUSH (FOR BLOOD PRESSURE SUPPORT)
PREFILLED_SYRINGE | INTRAVENOUS | Status: DC | PRN
  Administered 2019-06-21: 80 ug via INTRAVENOUS
  Administered 2019-06-21: 40 ug via INTRAVENOUS
  Administered 2019-06-21: 60 ug via INTRAVENOUS
  Administered 2019-06-21: 40 ug via INTRAVENOUS

## 2019-06-21 MED ORDER — PROMETHAZINE HCL 25 MG RE SUPP
25.0000 mg | Freq: Four times a day (QID) | RECTAL | Status: DC | PRN
  Filled 2019-06-21: qty 1

## 2019-06-21 MED ORDER — CLINDAMYCIN PHOSPHATE 900 MG/50ML IV SOLN
INTRAVENOUS | Status: AC
  Filled 2019-06-21: qty 50

## 2019-06-21 MED ORDER — LIDOCAINE 2% (20 MG/ML) 5 ML SYRINGE
INTRAMUSCULAR | Status: DC | PRN
  Administered 2019-06-21: 60 mg via INTRAVENOUS

## 2019-06-21 MED ORDER — CLINDAMYCIN PHOSPHATE 900 MG/50ML IV SOLN
INTRAVENOUS | Status: DC | PRN
  Administered 2019-06-21: 900 mg via INTRAVENOUS

## 2019-06-21 MED ORDER — PROMETHAZINE HCL 25 MG PO TABS: 25.0000 mg | ORAL_TABLET | Freq: Four times a day (QID) | ORAL | Status: DC | PRN

## 2019-06-21 MED ORDER — FENTANYL CITRATE (PF) 100 MCG/2ML IJ SOLN
INTRAMUSCULAR | Status: DC | PRN
  Administered 2019-06-21 (×3): 50 ug via INTRAVENOUS
  Administered 2019-06-21: 100 ug via INTRAVENOUS

## 2019-06-21 MED ORDER — FENTANYL CITRATE (PF) 250 MCG/5ML IJ SOLN
INTRAMUSCULAR | Status: AC
  Filled 2019-06-21: qty 5

## 2019-06-21 MED ORDER — OXYMETAZOLINE HCL 0.05 % NA SOLN
NASAL | Status: AC
  Filled 2019-06-21: qty 30

## 2019-06-21 MED ORDER — LORATADINE 10 MG PO TABS: 10.0000 mg | ORAL_TABLET | Freq: Every day | ORAL | Status: DC

## 2019-06-21 MED ORDER — CALCIUM CARBONATE-VITAMIN D 500-200 MG-UNIT PO TABS
1.0000 | ORAL_TABLET | Freq: Every day | ORAL | Status: DC
  Administered 2019-06-22: 1 via ORAL
  Filled 2019-06-21: qty 1

## 2019-06-21 MED ORDER — TRIAMCINOLONE ACETONIDE 0.1 % MT PSTE: 1.0000 "application " | PASTE | Freq: Two times a day (BID) | OROMUCOSAL | Status: DC | PRN

## 2019-06-21 MED ORDER — BACITRACIN ZINC 500 UNIT/GM EX OINT
TOPICAL_OINTMENT | CUTANEOUS | Status: AC
  Filled 2019-06-21: qty 28.35

## 2019-06-21 MED ORDER — DEXAMETHASONE SODIUM PHOSPHATE 10 MG/ML IJ SOLN
INTRAMUSCULAR | Status: AC
  Filled 2019-06-21: qty 1

## 2019-06-21 MED ORDER — ONDANSETRON HCL 4 MG/2ML IJ SOLN
INTRAMUSCULAR | Status: DC | PRN
  Administered 2019-06-21: 4 mg via INTRAVENOUS

## 2019-06-21 SURGICAL SUPPLY — 42 items
ATTRACTOMAT 16X20 MAGNETIC DRP (DRAPES) ×2 IMPLANT
BLADE SURG 15 STRL LF DISP TIS (BLADE) IMPLANT
BLADE SURG 15 STRL SS (BLADE)
CANISTER SUCT 3000ML PPV (MISCELLANEOUS) ×2 IMPLANT
CLEANER TIP ELECTROSURG 2X2 (MISCELLANEOUS) ×2 IMPLANT
CONT SPEC 4OZ CLIKSEAL STRL BL (MISCELLANEOUS) ×2 IMPLANT
CORD BIPOLAR FORCEPS 12FT (ELECTRODE) ×2 IMPLANT
COVER SURGICAL LIGHT HANDLE (MISCELLANEOUS) ×2 IMPLANT
DRAPE HALF SHEET 40X57 (DRAPES) ×2 IMPLANT
ELECT COATED BLADE 2.86 ST (ELECTRODE) ×2 IMPLANT
ELECT REM PT RETURN 9FT ADLT (ELECTROSURGICAL) ×2
ELECTRODE REM PT RTRN 9FT ADLT (ELECTROSURGICAL) ×1 IMPLANT
FORCEPS BIPOLAR SPETZLER 8 1.0 (NEUROSURGERY SUPPLIES) ×2 IMPLANT
GAUZE 4X4 16PLY RFD (DISPOSABLE) IMPLANT
GAUZE SPONGE 4X4 12PLY STRL (GAUZE/BANDAGES/DRESSINGS) IMPLANT
GLOVE BIOGEL PI IND STRL 7.0 (GLOVE) ×1 IMPLANT
GLOVE BIOGEL PI INDICATOR 7.0 (GLOVE) ×1
GLOVE ECLIPSE 7.5 STRL STRAW (GLOVE) IMPLANT
GLOVE SURG SS PI 6.5 STRL IVOR (GLOVE) ×2 IMPLANT
GLOVE SURG SS PI 7.0 STRL IVOR (GLOVE) ×2 IMPLANT
GLOVE SURG SYN 7.5  E (GLOVE) ×1
GLOVE SURG SYN 7.5 E (GLOVE) ×1 IMPLANT
GOWN STRL REUS W/ TWL LRG LVL3 (GOWN DISPOSABLE) ×2 IMPLANT
GOWN STRL REUS W/TWL LRG LVL3 (GOWN DISPOSABLE) ×2
KIT BASIN OR (CUSTOM PROCEDURE TRAY) ×2 IMPLANT
KIT TURNOVER KIT B (KITS) ×2 IMPLANT
LOCATOR NERVE 3 VOLT (DISPOSABLE) IMPLANT
NEEDLE PRECISIONGLIDE 27X1.5 (NEEDLE) ×2 IMPLANT
NS IRRIG 1000ML POUR BTL (IV SOLUTION) ×2 IMPLANT
PENCIL FOOT CONTROL (ELECTRODE) ×2 IMPLANT
POSITIONER HEAD DONUT 9IN (MISCELLANEOUS) ×2 IMPLANT
SPONGE INTESTINAL PEANUT (DISPOSABLE) ×2 IMPLANT
SUT SILK 0 FSL (SUTURE) ×2 IMPLANT
SUT SILK 3 0 REEL (SUTURE) ×2 IMPLANT
SUT SILK 3 0 SH CR/8 (SUTURE) ×2 IMPLANT
SUT SILK 4 0 REEL (SUTURE) ×2 IMPLANT
SUT VIC AB 3-0 SH 27 (SUTURE) ×2
SUT VIC AB 3-0 SH 27XBRD (SUTURE) ×2 IMPLANT
SUT VIC AB 4-0 RB1 18 (SUTURE) IMPLANT
TOWEL GREEN STERILE FF (TOWEL DISPOSABLE) ×2 IMPLANT
TRAY ENT MC OR (CUSTOM PROCEDURE TRAY) ×2 IMPLANT
WATER STERILE IRR 1000ML POUR (IV SOLUTION) ×2 IMPLANT

## 2019-06-21 NOTE — Transfer of Care (Signed)
Immediate Anesthesia Transfer of Care Note  Patient: Joan Boyle  Procedure(s) Performed: GLOSSECTOMY  Partial (Right Mouth)  Patient Location: PACU  Anesthesia Type:General  Level of Consciousness: awake  Airway & Oxygen Therapy: Patient Spontanous Breathing and Patient connected to nasal cannula oxygen  Post-op Assessment: Report given to RN and Post -op Vital signs reviewed and stable  Post vital signs: Reviewed and stable  Last Vitals:  Vitals Value Taken Time  BP 114/54 06/21/19 1007  Temp    Pulse 74 06/21/19 1007  Resp 13 06/21/19 1007  SpO2 100 % 06/21/19 1007  Vitals shown include unvalidated device data.  Last Pain:  Vitals:   06/21/19 0720  TempSrc:   PainSc: 1       Patients Stated Pain Goal: 3 ([phone])  Complications: No apparent anesthesia complications

## 2019-06-21 NOTE — Interval H&P Note (Signed)
History and Physical Interval Note:  06/21/2019 8:14 AM  Joan Boyle  has presented today for surgery, with the diagnosis of Tongue lesion.  The various methods of treatment have been discussed with the patient and family. After consideration of risks, benefits and other options for treatment, the patient has consented to  Procedure(s): GLOSSECTOMY  Partial (Right) as a surgical intervention.  The patient's history has been reviewed, patient examined, no change in status, stable for surgery.  I have reviewed the patient's chart and labs.  Questions were answered to the patient's satisfaction.     [name]

## 2019-06-21 NOTE — Anesthesia Preprocedure Evaluation (Signed)
Anesthesia Evaluation  Patient identified by MRN, date of birth, ID band Patient awake    Reviewed: Allergy & Precautions, H&P , NPO status , Patient's Chart, lab work & pertinent test results  Airway Mallampati: II   Neck ROM: full    Dental   Pulmonary asthma ,    breath sounds clear to auscultation       Cardiovascular negative cardio ROS   Rhythm:regular Rate:Normal     Neuro/Psych    GI/Hepatic GERD  ,Tongue lesion   Endo/Other    Renal/GU      Musculoskeletal   Abdominal   Peds  Hematology   Anesthesia Other Findings   Reproductive/Obstetrics                             Anesthesia Physical Anesthesia Plan  ASA: II  Anesthesia Plan: General   Post-op Pain Management:    Induction: Intravenous  PONV Risk Score and Plan: 3 and Ondansetron, Dexamethasone, Midazolam and Treatment may vary due to age or medical condition  Airway Management Planned: Oral ETT  Additional Equipment:   Intra-op Plan:   Post-operative Plan: Extubation in OR  Informed Consent: I have reviewed the patients History and Physical, chart, labs and discussed the procedure including the risks, benefits and alternatives for the proposed anesthesia with the patient or authorized representative who has indicated his/her understanding and acceptance.       Plan Discussed with: CRNA, Anesthesiologist and Surgeon  Anesthesia Plan Comments:         Anesthesia Quick Evaluation

## 2019-06-21 NOTE — Anesthesia Procedure Notes (Signed)
Procedure Name: Intubation Date/Time: 06/21/2019 8:38 AM Performed by: Lieutenant Diego, CRNA Pre-anesthesia Checklist: Patient identified, Emergency Drugs available, Suction available and Patient being monitored Patient Re-evaluated:Patient Re-evaluated prior to induction Oxygen Delivery Method: Circle system utilized Preoxygenation: Pre-oxygenation with 100% oxygen Induction Type: IV induction Ventilation: Mask ventilation without difficulty Laryngoscope Size: Miller and 2 Grade View: Grade I Tube type: Oral Tube size: 7.0 mm Number of attempts: 1 Airway Equipment and Method: Stylet Placement Confirmation: ETT inserted through vocal cords under direct vision,  positive ETCO2 and breath sounds checked- equal and bilateral Secured at: 21 cm Tube secured with: Tape Dental Injury: Teeth and Oropharynx as per pre-operative assessment

## 2019-06-21 NOTE — Progress Notes (Signed)
   ENT Progress Note: s/p Procedure(s): GLOSSECTOMY  Partial   Subjective: tol po, pain stable  Objective: Vital signs in last 24 hours: Temp:  [97.6 F (36.4 C)-98.3 F (36.8 C)] 98.3 F (36.8 C) (11/18 1210) Pulse Rate:  [68-75] 70 (11/18 1210) Resp:  [11-22] 12 (11/18 1210) BP: (114-137)/(54-75) 130/68 (11/18 1210) SpO2:  [99 %-100 %] 99 % (11/18 1210) Weight:  [63.2 kg] 63.2 kg (11/18 0720) Weight change:     Intake/Output from previous day: No intake/output data recorded. Intake/Output this shift: Total I/O In: 1870.6 [P.O.:600; I.V.:1270.6] Out: 625 [Urine:600; Blood:25]  Labs: Recent Labs    06/19/19 0828  WBC 4.5  HGB 13.8  HCT 42.9  PLT 187   No results for input(s): NA, K, CL, CO2, GLUCOSE, BUN, CALCIUM in the last 72 hours.  Invalid input(s): CREATININR  Studies/Results: No results found.   PHYSICAL EXAM: No bleeding Airway stable   Assessment/Plan: Monitor O/N for intake and swelling    Jerrell Belfast 06/21/2019, 3:26 PM

## 2019-06-21 NOTE — Progress Notes (Signed)
Pt arrived from PACU in bed, alert and oriented, c/o pain, gave oral Hycet.  Pt up and urinated in the bathroom, ice pak to right side of face, HOB up 45 degrees.

## 2019-06-21 NOTE — Op Note (Addendum)
OPERATIVE REPORT  DATE OF SURGERY: 06/21/2019  PATIENT:  Joan Boyle,  63 y.o. female  PRE-OPERATIVE DIAGNOSIS:  Tongue cancer  POST-OPERATIVE DIAGNOSIS:  Tongue cancer  PROCEDURE:  Procedure(s): GLOSSECTOMY  Partial  SURGEON:  Beckie Salts, MD  ASSISTANTS: Jolene Provost PA  ANESTHESIA:   General   EBL: 50 ml  DRAINS: None  LOCAL MEDICATIONS USED:  None  SPECIMEN: Right partial glossectomy, suture marks anterior and superior margins.  Frozen section, all margins negative.  COUNTS:  Correct  PROCEDURE DETAILS: The patient was taken to the operating room and placed on the operating table in the supine position. Following induction of general endotracheal anesthesia, patient was prepped and draped in standard fashion.  A bite block was used on the left side.  A towel clamp was used at the tip of the tongue to retract the tongue.  Electrocautery was used to mark the proposed incision sites all along the lesion on the right lateral tongue.  1.5 cm margins were planned throughout in all directions.  Electrocautery was then used to perform the complete dissection.  4-0 silk ties were used as needed for the larger vessels.  Bipolar cautery was used as well for hemostasis.  Lesion was oriented with sutures and sent for frozen section analysis.  The defect was then closed using interrupted inverted 3-0 Vicryl sutures.  The oral cavity was rinsed with saline and the oral cavity and pharynx were suctioned of secretions.  Patient was then awakened extubated and transferred to recovery in stable condition.  Surgical assistant was very helpful in retraction and suctioning and identifying bleeders.    PATIENT DISPOSITION:  To PACU, stable

## 2019-06-21 NOTE — Progress Notes (Signed)
PHARMACIST - PHYSICIAN ORDER COMMUNICATION  CONCERNING: P&T Medication Policy on Herbal Medications  DESCRIPTION:  This patient's order for:  CoQ10  has been noted.  This product(s) is classified as an "herbal" or natural product. Due to a lack of definitive safety studies or FDA approval, nonstandard manufacturing practices, plus the potential risk of unknown drug-drug interactions while on inpatient medications, the Pharmacy and Therapeutics Committee does not permit the use of "herbal" or natural products of this type within [facility].   ACTION TAKEN: The pharmacy department is unable to verify this order at this time and your patient has been informed of this safety policy. Please reevaluate patient's clinical condition at discharge and address if the herbal or natural product(s) should be resumed at that time.   Joson Sapp S. Alford Highland, PharmD, [place] Clinical Staff Pharmacist

## 2019-06-22 ENCOUNTER — Encounter (HOSPITAL_COMMUNITY): Payer: Self-pay | Admitting: Otolaryngology

## 2019-06-22 LAB — SURGICAL PATHOLOGY

## 2019-06-22 NOTE — Anesthesia Postprocedure Evaluation (Signed)
Anesthesia Post Note  Patient: Joan Boyle  Procedure(s) Performed: GLOSSECTOMY  Partial (Right Mouth)     Patient location during evaluation: PACU Anesthesia Type: General Level of consciousness: awake and alert Pain management: pain level controlled Vital Signs Assessment: post-procedure vital signs reviewed and stable Respiratory status: spontaneous breathing, nonlabored ventilation, respiratory function stable and patient connected to nasal cannula oxygen Cardiovascular status: blood pressure returned to baseline and stable Postop Assessment: no apparent nausea or vomiting Anesthetic complications: no    Last Vitals:  Vitals:   06/22/19 0249 06/22/19 0547  BP: 95/65 105/71  Pulse: 64 68  Resp: 16 18  Temp: 36.9 C 36.9 C  SpO2: 97% 99%    Last Pain:  Vitals:   06/22/19 0547  TempSrc: Oral  PainSc:    Pain Goal: Patients Stated Pain Goal: 3 (06/21/19 0720)                 [place]

## 2019-06-22 NOTE — Discharge Summary (Signed)
  Physician Discharge Summary  Patient ID: Joan Boyle MRN: [id number] DOB/AGE: 63-Sep-1957 63 y.o.  Admit date: 06/21/2019 Discharge date: 06/22/2019  Admission [place] [facility]  Discharge Diagnoses:  Active Problems:   Tongue cancer [facility])   Discharged Condition: good  Hospital Course: no complications  Consults: none  Significant Diagnostic Studies: none  Treatments: surgery: partial glossectomy  Discharge Exam: Blood pressure 105/71, pulse 68, temperature 98.4 F (36.9 C), temperature source Oral, resp. rate 18, height 5\' 4"  (1.626 m), weight 63.2 kg, SpO2 99 %. PHYSICAL EXAM: Awake and alert, breathing and speech normal. Tongue suture line intact, no swelling.  Disposition: Discharge disposition: 01-Home or Self Care       Discharge Instructions    Diet - low sodium heart healthy   Complete by: As directed    Increase activity slowly   Complete by: As directed      Allergies as of 06/22/2019      Reactions   Penicillins Rash, Other (See Comments)   Did it involve swelling of the face/tongue/throat, SOB, or low BP? YES Did it involve sudden or severe rash/hives, skin peeling, or any reaction on the inside of your mouth or nose? Yes Did you need to seek medical attention at a hospital or doctor's office? Yes When did it last happen?childhood   Latex Swelling   SWELLING OF EYES   Toradol [ketorolac Tromethamine] Rash      Medication List    TAKE these medications   AIRBORNE PO Take 1 tablet by mouth daily.   albuterol 108 (90 Base) MCG/ACT inhaler Commonly known as: VENTOLIN HFA Inhale into the lungs every 6 (six) hours as needed for wheezing or shortness of breath.   calcium-vitamin D 500-200 MG-UNIT tablet Commonly known as: OSCAL WITH D Take 1 tablet by mouth daily with breakfast.   cholecalciferol 25 MCG (1000 UT) tablet Commonly known as: VITAMIN D3 Take 1,000 Units by mouth daily.   clindamycin 300 MG capsule Commonly  known as: Cleocin Take 1 capsule (300 mg total) by mouth 3 (three) times daily.   Coenzyme Q10 150 MG Caps Take 200 mg by mouth daily.   EpiPen 0.3 mg/0.3 mL Soaj injection Generic drug: EPINEPHrine Inject into the muscle once.   famotidine 20 MG tablet Commonly known as: PEPCID Take 20 mg by mouth at bedtime.   HYDROcodone-acetaminophen 7.5-325 MG tablet Commonly known as: Norco Take 1 tablet by mouth every 6 (six) hours as needed for moderate pain.   lidocaine 2 % solution Commonly known as: XYLOCAINE Use as directed 15 mLs in the mouth or throat every 3 (three) hours as needed for mouth pain.   loratadine 10 MG tablet Commonly known as: CLARITIN Take 10 mg by mouth daily.   promethazine 25 MG suppository Commonly known as: PHENERGAN Place 1 suppository (25 mg total) rectally every 6 (six) hours as needed for nausea or vomiting.   triamcinolone 0.1 % paste Commonly known as: KENALOG Use as directed 1 application in the mouth or throat 2 (two) times daily as needed (irritation).      Follow-up Information    [name], MD. Schedule an appointment as soon as possible for a visit in 2 week(s).   Specialty: Otolaryngology Contact information: [address] [place] [address] [phone]           Signed: [name] 06/22/2019, 9:52 AM

## 2019-06-22 NOTE — Discharge Instructions (Signed)
Diet as tolerated

## 2019-07-05 ENCOUNTER — Other Ambulatory Visit (HOSPITAL_COMMUNITY): Payer: Self-pay | Admitting: Otolaryngology

## 2019-07-05 ENCOUNTER — Other Ambulatory Visit: Payer: Self-pay | Admitting: Otolaryngology

## 2019-07-05 DIAGNOSIS — C029 Malignant neoplasm of tongue, unspecified: Secondary | ICD-10-CM

## 2019-07-11 ENCOUNTER — Other Ambulatory Visit: Payer: Self-pay

## 2019-07-11 ENCOUNTER — Ambulatory Visit (HOSPITAL_COMMUNITY)
Admission: RE | Admit: 2019-07-11 | Discharge: 2019-07-11 | Disposition: A | Discharge status: Home / Self Care | Payer: 59 | Source: Ambulatory Visit | Attending: Otolaryngology | Admitting: Otolaryngology

## 2019-07-11 DIAGNOSIS — C029 Malignant neoplasm of tongue, unspecified: Secondary | ICD-10-CM

## 2019-07-11 LAB — GLUCOSE, CAPILLARY: Glucose-Capillary: 87 mg/dL (ref 70–99)

## 2019-07-11 MED ORDER — FLUDEOXYGLUCOSE F - 18 (FDG) INJECTION: 6.5000 | Freq: Once | INTRAVENOUS | Status: DC | PRN

## 2019-09-06 ENCOUNTER — Ambulatory Visit: Payer: Commercial Managed Care - PPO | Attending: Internal Medicine

## 2019-09-06 DIAGNOSIS — Z20822 Contact with and (suspected) exposure to covid-19: Secondary | ICD-10-CM

## 2019-09-07 LAB — NOVEL CORONAVIRUS, NAA: SARS-CoV-2, NAA: NOT DETECTED

## 2020-01-12 IMAGING — PT NM PET TUM IMG INITIAL (PI) SKULL BASE T - THIGH
1 of 7 series · 2 of 25 positions shown · non-contrast
Comparison: Neck CT 10/19/2012.

CLINICAL DATA: Initial treatment strategy for head and neck
carcinoma. Lingual carcinoma. Status post partial glossectomy

EXAM:
NUCLEAR MEDICINE PET SKULL BASE TO THIGH
TECHNIQUE: 6.5 mCi F-18 FDG was injected intravenously. Full-ring PET imaging
was performed from the skull base to thigh after the radiotracer. CT
data was obtained and used for attenuation correction and anatomic
localization.
Fasting blood glucose: 87 mg/dl

[Series 4: ct hn_sk_th 5.0 b31f · axial · 5.0mm · 0.98mm/px · z∈[-467,-247]mm · 2 of 221 slices shown]
[im 166/221  brain]
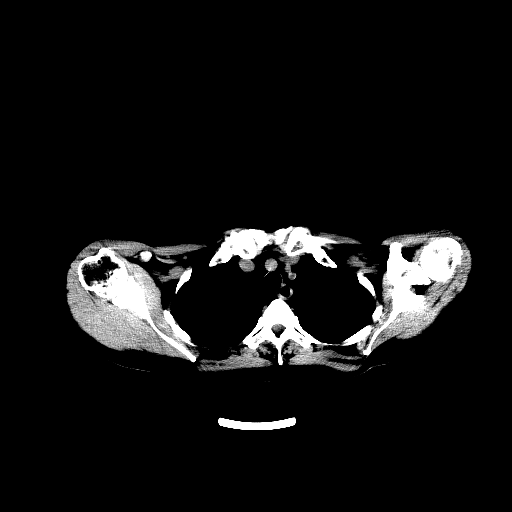
[im 221/221  brain]
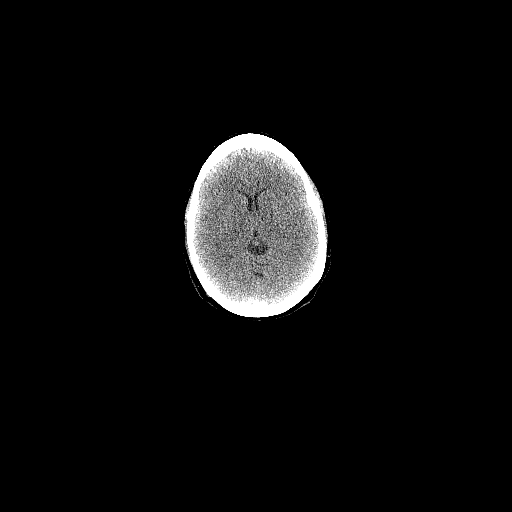

[2 of 25 positions shown; findings below may reference images not displayed]

FINDINGS: Mediastinal blood pool activity: SUV max

Liver activity: SUV max NA

NECK: Intense hypermetabolic activity present in the RIGHT floor the
mouth. Lesion is elongated along the bucal space measuring
approximately 2.5 cm in length and 1 cm in width with SUV max equal
7.9. Lesion not appreciated on the noncontrast CT portion exam.

There is a smaller focus of activity in the LEFT floor of the mouth
(image 30 fused data set) with SUV max equal 5.1. This appears
associated with the myohyoid muscle (image 29 series 4 of the CT
data set).

No hypermetabolic lymph nodes in the neck.

Symmetric activity at the level of the lingual tonsils off fairly
intense (SUV max equal 7.2) is favored benign.

Incidental CT findings: LEFT submandibular gland is absent.

CHEST: No hypermetabolic mediastinal or hilar nodes. No suspicious
pulmonary nodules on the CT scan.

Incidental CT findings: none

ABDOMEN/PELVIS: No abnormal hypermetabolic activity within the
liver, pancreas, adrenal glands, or spleen. No hypermetabolic lymph
nodes in the abdomen or pelvis.

Incidental CT findings: Hypodense lesion in the RIGHT hepatic lobe
measuring 1.7 cm without associated metabolic activity is favored
benign. Calcified exophytic uterine fibroid noted.

SKELETON: No focal hypermetabolic activity to suggest skeletal
metastasis.

Incidental CT findings: none
IMPRESSION: 1. Linear metabolic activity in the RIGHT floor of the mouth along
the bucal space is favored to represents postsurgical inflammation
from the partial RIGHT glossectomy.
2. Focus of activity in the LEFT floor the mouth appears associated
with myohyoid muscle and may related to surgery. Recommend clinical
correlation.
3. No hypermetabolic lymph nodes in the neck.
4. Metabolic activity in the lingual tonsils is symmetric and
therefore favored benign.
5. No evidence distant metastatic disease.

## 2020-03-08 NOTE — H&P (Signed)
Joan Boyle is an 64 y.o. female.   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  Past Medical History:  Diagnosis Date  . Abnormally small mouth   . Asthma    exercise-induced; prn inhaler  . 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 GERD (gastroesophageal reflux disease)     Past Surgical History:  Procedure Laterality Date  . EXCISION OF TONGUE LESION WITH LASER  2011  . EXCISION OF TONGUE LESION WITH LASER Right 05/29/2019   Procedure: Excision Right Tongue Lesion w/CO2 laser, Frozen section;  Surgeon: [name], MD;  Location: [facility];  Service: ENT;  Laterality: Right;  . GLOSSECTOMY Right 06/21/2019   Procedure: GLOSSECTOMY  Partial;  Surgeon: [name], MD;  Location: [facility];  Service: ENT;  Laterality: Right;  . MINOR C02 LASER EXCISION OF ORAL LESION Right 12/11/2013   Procedure: CO2 LASER ABLATION RIGHT SIDE OF TONGUE   (MINOR PROCEDURE) ;  Surgeon: [name], MD;  Location: [facility];  Service: ENT;  Laterality: Right;  . SALIVARY STONE REMOVAL  08/01/2012   Procedure: SALIVARY STONE REMOVAL;  Surgeon: [name], MD;  Location: [facility];  Service: ENT;  Laterality: Bilateral;  FLOOR OF MOUTH EXCISION POSSIBLE SUBMANDIBULAR DUCT RE-ROUTE  . WISDOM TOOTH EXTRACTION      No family history on file. Social History:  reports that she has never smoked. She has never used smokeless tobacco. She reports current alcohol use. She reports that she does not use drugs.  Allergies:  Allergies  Allergen Reactions  . Penicillins Rash and Other (See Comments)    Did it involve swelling of the face/tongue/throat, SOB, or low BP? YES Did it involve sudden or severe rash/hives, skin peeling, or any reaction on the inside of your  mouth or nose? Yes Did you need to seek medical attention at a hospital or doctor's office? Yes When did it last happen?childhood   . Latex Swelling    SWELLING OF EYES  . Toradol [Ketorolac Tromethamine] Rash    No medications prior to admission.    No results found for this or any previous visit (from the past 48 hour(s)). No results found.  ROS: otherwise negative  There were no vitals taken for this visit.  PHYSICAL EXAM: Overall appearance:  Healthy appearing, in no distress Head:  Normocephalic, atraumatic. Ears: External auditory canals are clear; tympanic membranes are intact and the middle ears are free of any effusion. Nose: External nose is healthy in appearance. Internal nasal exam free of any lesions or obstruction. Oral Cavity/pharynx: Healthy scarring of the right lateral tongue and the floor of mouth.  2 cm area of leukoplakia with central erythroplakia along the right posterior lateral oral tongue/floor of mouth gutter.. Hypopharynx/Larynx: no signs of any mucosal lesions or masses identified. Vocal cords move normally. Neuro:  No identifiable neurologic deficits. Neck: No palpable neck masses.  Studies Reviewed: none    Assessment/Plan Tongue/floor of mouth leukoplakia.  Recommend biopsy and CO2 laser ablation.  Risks and benefits were discussed.  [name] 03/08/2020, 10:05 AM

## 2020-03-19 ENCOUNTER — Other Ambulatory Visit: Payer: Self-pay

## 2020-03-19 ENCOUNTER — Encounter (HOSPITAL_BASED_OUTPATIENT_CLINIC_OR_DEPARTMENT_OTHER): Payer: Self-pay | Admitting: Otolaryngology

## 2020-03-20 ENCOUNTER — Other Ambulatory Visit: Payer: Self-pay | Admitting: Family Medicine

## 2020-03-20 DIAGNOSIS — Z1231 Encounter for screening mammogram for malignant neoplasm of breast: Secondary | ICD-10-CM

## 2020-03-21 ENCOUNTER — Other Ambulatory Visit (HOSPITAL_COMMUNITY)
Admission: RE | Admit: 2020-03-21 | Discharge: 2020-03-21 | Disposition: A | Discharge status: Home / Self Care | Payer: Commercial Managed Care - PPO | Source: Ambulatory Visit | Attending: Otolaryngology | Admitting: Otolaryngology

## 2020-03-21 DIAGNOSIS — Z01812 Encounter for preprocedural laboratory examination: Secondary | ICD-10-CM | POA: Diagnosis not present

## 2020-03-21 DIAGNOSIS — Z20822 Contact with and (suspected) exposure to covid-19: Secondary | ICD-10-CM | POA: Diagnosis not present

## 2020-03-21 LAB — SARS CORONAVIRUS 2 (TAT 6-24 HRS): SARS Coronavirus 2: NEGATIVE

## 2020-03-24 NOTE — Anesthesia Preprocedure Evaluation (Addendum)
Anesthesia Evaluation  Patient identified by MRN, date of birth, ID band Patient awake    Reviewed: Allergy & Precautions, NPO status , Patient's Chart, lab work & pertinent test results  Airway Mallampati: IV  TM Distance: >3 FB Neck ROM: Full    Dental no notable dental hx.    Pulmonary asthma ,    Pulmonary exam normal breath sounds clear to auscultation       Cardiovascular negative cardio ROS Normal cardiovascular exam Rhythm:Regular Rate:Normal     Neuro/Psych negative neurological ROS  negative psych ROS   GI/Hepatic Neg liver ROS, GERD  Medicated and Controlled,  Endo/Other  negative endocrine ROS  Renal/GU negative Renal ROS     Musculoskeletal negative musculoskeletal ROS (+)   Abdominal   Peds  Hematology negative hematology ROS (+)   Anesthesia Other Findings TONGUE CANCER  Reproductive/Obstetrics                            Anesthesia Physical Anesthesia Plan  ASA: II  Anesthesia Plan: MAC   Post-op Pain Management:    Induction: Intravenous  PONV Risk Score and Plan: 3 and Ondansetron, Dexamethasone, Midazolam, Treatment may vary due to age or medical condition and Propofol infusion  Airway Management Planned: Nasal Cannula  Additional Equipment:   Intra-op Plan:   Post-operative Plan:   Informed Consent:     Dental advisory given  Plan Discussed with: CRNA  Anesthesia Plan Comments:        Anesthesia Quick Evaluation

## 2020-03-25 ENCOUNTER — Encounter (HOSPITAL_BASED_OUTPATIENT_CLINIC_OR_DEPARTMENT_OTHER)
Admission: RE | Disposition: A | Discharge status: Home / Self Care | Payer: Self-pay | Source: Home / Self Care | Attending: Otolaryngology

## 2020-03-25 ENCOUNTER — Ambulatory Visit (HOSPITAL_BASED_OUTPATIENT_CLINIC_OR_DEPARTMENT_OTHER)
Admission: RE | Admit: 2020-03-25 | Discharge: 2020-03-25 | Disposition: A | Discharge status: Home / Self Care | Payer: Commercial Managed Care - PPO | Attending: Otolaryngology | Admitting: Otolaryngology

## 2020-03-25 ENCOUNTER — Encounter (HOSPITAL_BASED_OUTPATIENT_CLINIC_OR_DEPARTMENT_OTHER): Payer: Self-pay | Admitting: Otolaryngology

## 2020-03-25 ENCOUNTER — Ambulatory Visit (HOSPITAL_BASED_OUTPATIENT_CLINIC_OR_DEPARTMENT_OTHER): Payer: Commercial Managed Care - PPO | Admitting: Certified Registered"

## 2020-03-25 ENCOUNTER — Other Ambulatory Visit: Payer: Self-pay

## 2020-03-25 DIAGNOSIS — J45909 Unspecified asthma, uncomplicated: Secondary | ICD-10-CM | POA: Insufficient documentation

## 2020-03-25 DIAGNOSIS — K140 Glossitis: Secondary | ICD-10-CM | POA: Insufficient documentation

## 2020-03-25 DIAGNOSIS — Z888 Allergy status to other drugs, medicaments and biological substances status: Secondary | ICD-10-CM | POA: Insufficient documentation

## 2020-03-25 DIAGNOSIS — K1321 Leukoplakia of oral mucosa, including tongue: Secondary | ICD-10-CM | POA: Insufficient documentation

## 2020-03-25 DIAGNOSIS — Z880 Allergy status to penicillin: Secondary | ICD-10-CM | POA: Insufficient documentation

## 2020-03-25 DIAGNOSIS — Z85810 Personal history of malignant neoplasm of tongue: Secondary | ICD-10-CM | POA: Diagnosis not present

## 2020-03-25 DIAGNOSIS — Z91040 Latex allergy status: Secondary | ICD-10-CM | POA: Diagnosis not present

## 2020-03-25 HISTORY — PX: EXCISION ORAL LESION WITH CO2 LASER: SHX6461

## 2020-03-25 SURGERY — DESTRUCTION, LESION, MOUTH, USING CO2 LASER
Anesthesia: Monitor Anesthesia Care | Site: Mouth | Laterality: Right

## 2020-03-25 MED ORDER — OXYCODONE HCL 5 MG/5ML PO SOLN: 5.0000 mg | Freq: Once | ORAL | Status: DC | PRN

## 2020-03-25 MED ORDER — PROPOFOL 500 MG/50ML IV EMUL
INTRAVENOUS | Status: DC | PRN
  Administered 2020-03-25: 25 ug/kg/min via INTRAVENOUS

## 2020-03-25 MED ORDER — FENTANYL CITRATE (PF) 100 MCG/2ML IJ SOLN
INTRAMUSCULAR | Status: AC
  Filled 2020-03-25: qty 2

## 2020-03-25 MED ORDER — FENTANYL CITRATE (PF) 100 MCG/2ML IJ SOLN: 25.0000 ug | INTRAMUSCULAR | Status: DC | PRN

## 2020-03-25 MED ORDER — LIDOCAINE-EPINEPHRINE 1 %-1:100000 IJ SOLN
INTRAMUSCULAR | Status: AC
  Filled 2020-03-25: qty 3

## 2020-03-25 MED ORDER — MIDAZOLAM HCL 2 MG/2ML IJ SOLN
INTRAMUSCULAR | Status: AC
  Filled 2020-03-25: qty 2

## 2020-03-25 MED ORDER — OXYCODONE HCL 5 MG PO TABS: 5.0000 mg | ORAL_TABLET | Freq: Once | ORAL | Status: DC | PRN

## 2020-03-25 MED ORDER — LACTATED RINGERS IV SOLN: INTRAVENOUS | Status: DC

## 2020-03-25 MED ORDER — PROMETHAZINE HCL 25 MG/ML IJ SOLN: 6.2500 mg | INTRAMUSCULAR | Status: DC | PRN

## 2020-03-25 MED ORDER — EPINEPHRINE PF 1 MG/ML IJ SOLN
INTRAMUSCULAR | Status: AC
  Filled 2020-03-25: qty 2

## 2020-03-25 MED ORDER — PROPOFOL 500 MG/50ML IV EMUL
INTRAVENOUS | Status: AC
  Filled 2020-03-25: qty 50

## 2020-03-25 MED ORDER — LIDOCAINE-EPINEPHRINE 1 %-1:100000 IJ SOLN
INTRAMUSCULAR | Status: DC | PRN
  Administered 2020-03-25: 5 mL

## 2020-03-25 MED ORDER — ONDANSETRON HCL 4 MG/2ML IJ SOLN
INTRAMUSCULAR | Status: DC | PRN
  Administered 2020-03-25: 4 mg via INTRAVENOUS

## 2020-03-25 MED ORDER — MIDAZOLAM HCL 5 MG/5ML IJ SOLN
INTRAMUSCULAR | Status: DC | PRN
  Administered 2020-03-25: 2 mg via INTRAVENOUS

## 2020-03-25 MED ORDER — FENTANYL CITRATE (PF) 100 MCG/2ML IJ SOLN
INTRAMUSCULAR | Status: DC | PRN
Start: 2020-03-25 — End: 2020-03-25
  Administered 2020-03-25 (×2): 50 ug via INTRAVENOUS

## 2020-03-25 MED ORDER — ONDANSETRON HCL 4 MG/2ML IJ SOLN
INTRAMUSCULAR | Status: AC
  Filled 2020-03-25: qty 2

## 2020-03-25 MED ORDER — ACETAMINOPHEN 500 MG PO TABS
1000.0000 mg | ORAL_TABLET | Freq: Once | ORAL | Status: AC
  Administered 2020-03-25: 1000 mg via ORAL

## 2020-03-25 MED ORDER — SILVER NITRATE-POT NITRATE 75-25 % EX MISC
CUTANEOUS | Status: AC
  Filled 2020-03-25: qty 10

## 2020-03-25 MED ORDER — OXYMETAZOLINE HCL 0.05 % NA SOLN
NASAL | Status: AC
  Filled 2020-03-25: qty 60

## 2020-03-25 MED ORDER — ACETAMINOPHEN 500 MG PO TABS
ORAL_TABLET | ORAL | Status: AC
  Filled 2020-03-25: qty 2

## 2020-03-25 MED ORDER — BACITRACIN ZINC 500 UNIT/GM EX OINT
TOPICAL_OINTMENT | CUTANEOUS | Status: AC
  Filled 2020-03-25: qty 1.8

## 2020-03-25 SURGICAL SUPPLY — 41 items
BLADE SURG 15 STRL LF DISP TIS (BLADE) IMPLANT
BLADE SURG 15 STRL SS (BLADE)
BNDG EYE OVAL (GAUZE/BANDAGES/DRESSINGS) ×4 IMPLANT
CANISTER SUCT 1200ML W/VALVE (MISCELLANEOUS) ×2 IMPLANT
COAGULATOR SUCT SWTCH 10FR 6 (ELECTROSURGICAL) IMPLANT
COVER MAYO STAND STRL (DRAPES) ×2 IMPLANT
COVER WAND RF STERILE (DRAPES) IMPLANT
DEPRESSOR TONGUE BLADE STERILE (MISCELLANEOUS) ×4 IMPLANT
ELECT COATED BLADE 2.86 ST (ELECTRODE) IMPLANT
ELECT REM PT RETURN 9FT ADLT (ELECTROSURGICAL)
ELECTRODE REM PT RTRN 9FT ADLT (ELECTROSURGICAL) IMPLANT
FILTER 7/8 IN (FILTER) ×2 IMPLANT
GAUZE SPONGE 4X4 12PLY STRL LF (GAUZE/BANDAGES/DRESSINGS) IMPLANT
GLOVE ECLIPSE 7.5 STRL STRAW (GLOVE) IMPLANT
GLOVE SURG SS PI 7.0 STRL IVOR (GLOVE) ×2 IMPLANT
GLOVE SURG SS PI 7.5 STRL IVOR (GLOVE) ×2 IMPLANT
GOWN STRL REUS W/ TWL LRG LVL3 (GOWN DISPOSABLE) ×1 IMPLANT
GOWN STRL REUS W/ TWL XL LVL3 (GOWN DISPOSABLE) ×1 IMPLANT
GOWN STRL REUS W/TWL LRG LVL3 (GOWN DISPOSABLE) ×2
GOWN STRL REUS W/TWL XL LVL3 (GOWN DISPOSABLE) ×2
GUARD TEETH (MISCELLANEOUS) IMPLANT
MARKER SKIN DUAL TIP RULER LAB (MISCELLANEOUS) IMPLANT
NEEDLE HYPO 30GX1 BEV (NEEDLE) IMPLANT
NEEDLE PRECISIONGLIDE 27X1.5 (NEEDLE) ×2 IMPLANT
NS IRRIG 1000ML POUR BTL (IV SOLUTION) ×2 IMPLANT
PACK BASIN DAY SURGERY FS (CUSTOM PROCEDURE TRAY) ×2 IMPLANT
PATTIES SURGICAL .5 X3 (DISPOSABLE) IMPLANT
PENCIL FOOT CONTROL (ELECTRODE) IMPLANT
PUNCH BIOPSY DERMAL 3MM (MISCELLANEOUS) ×2 IMPLANT
PUNCH BIOPSY DERMAL 4MM (MISCELLANEOUS) IMPLANT
REDUCTION FITTING 1/4 IN (FILTER) IMPLANT
SHEET MEDIUM DRAPE 40X70 STRL (DRAPES) ×2 IMPLANT
SLEEVE SCD COMPRESS KNEE MED (MISCELLANEOUS) IMPLANT
SUT CHROMIC 4 0 P 3 18 (SUTURE) IMPLANT
SUT SILK 3 0 PS 1 (SUTURE) IMPLANT
SUT VIC AB 3-0 SH 27 (SUTURE)
SUT VIC AB 3-0 SH 27X BRD (SUTURE) IMPLANT
SYR BULB EAR ULCER 3OZ GRN STR (SYRINGE) IMPLANT
SYR CONTROL 10ML LL (SYRINGE) ×2 IMPLANT
TOWEL GREEN STERILE FF (TOWEL DISPOSABLE) ×2 IMPLANT
TUBE CONNECTING 20X1/4 (TUBING) ×2 IMPLANT

## 2020-03-25 NOTE — Transfer of Care (Signed)
Immediate Anesthesia Transfer of Care Note  Patient: Joan Boyle  Procedure(s) Performed: CO2 LASER EXCISION W/BIOPSY OF POSTERIOR TONGUE (Right )  Patient Location: PACU  Anesthesia Type:MAC  Level of Consciousness: awake, alert  and oriented  Airway & Oxygen Therapy: Patient Spontanous Breathing  Post-op Assessment: Report given to RN and Post -op Vital signs reviewed and stable  Post vital signs: Reviewed and stable  Last Vitals:  Vitals Value Taken Time  BP 113/54 03/25/20 0800  Temp    Pulse 69 03/25/20 0802  Resp 11 03/25/20 0802  SpO2 100 % 03/25/20 0802  Vitals shown include unvalidated device data.  Last Pain:  Vitals:   03/25/20 0644  TempSrc: Oral  PainSc: 0-No pain         Complications: No complications documented.

## 2020-03-25 NOTE — Discharge Instructions (Signed)
Resume diet as tolerated.  Rinse with salt water several times daily.  Tylenol/Motrin for pain as needed.   Post Anesthesia Home Care Instructions  Activity: Get plenty of rest for the remainder of the day. A responsible individual must stay with you for 24 hours following the procedure.  For the next 24 hours, DO NOT: -Drive a car -Paediatric nurse -Drink alcoholic beverages -Take any medication unless instructed by your physician -Make any legal decisions or sign important papers.  Meals: Start with liquid foods such as gelatin or soup. Progress to regular foods as tolerated. Avoid greasy, spicy, heavy foods. If nausea and/or vomiting occur, drink only clear liquids until the nausea and/or vomiting subsides. Call your physician if vomiting continues.  Special Instructions/Symptoms: Your throat may feel dry or sore from the anesthesia or the breathing tube placed in your throat during surgery. If this causes discomfort, gargle with warm salt water. The discomfort should disappear within 24 hours.  If you had a scopolamine patch placed behind your ear for the management of post- operative nausea and/or vomiting:  1. The medication in the patch is effective for 72 hours, after which it should be removed.  Wrap patch in a tissue and discard in the trash. Wash hands thoroughly with soap and water. 2. You may remove the patch earlier than 72 hours if you experience unpleasant side effects which may include dry mouth, dizziness or visual disturbances. 3. Avoid touching the patch. Wash your hands with soap and water after contact with the patch.

## 2020-03-25 NOTE — Anesthesia Postprocedure Evaluation (Signed)
Anesthesia Post Note  Patient: Joan Boyle  Procedure(s) Performed: CO2 LASER EXCISION W/BIOPSY OF POSTERIOR TONGUE (Right Mouth)     Patient location during evaluation: PACU Anesthesia Type: MAC Level of consciousness: awake and alert Pain management: pain level controlled Vital Signs Assessment: post-procedure vital signs reviewed and stable Respiratory status: spontaneous breathing, nonlabored ventilation, respiratory function stable and patient connected to nasal cannula oxygen Cardiovascular status: stable and blood pressure returned to baseline Postop Assessment: no apparent nausea or vomiting Anesthetic complications: no   No complications documented.  Last Vitals:  Vitals:   03/25/20 0815 03/25/20 0834  BP: (!) 121/59 (!) 130/59  Pulse: 65 63  Resp: 14 18  Temp:  36.7 C  SpO2: 100%     Last Pain:  Vitals:   03/25/20 0834  TempSrc:   PainSc: 0-No pain                 Irelynd Zumstein P Jalysa Swopes

## 2020-03-25 NOTE — Op Note (Signed)
OPERATIVE REPORT  DATE OF SURGERY: 03/25/2020  PATIENT:  Joan Boyle,  64 y.o. female  PRE-OPERATIVE DIAGNOSIS:  TONGUE leukoplakia  POST-OPERATIVE DIAGNOSIS:  TONGUE leukoplakia   PROCEDURE:  Procedure(s): CO2 LASER EXCISION W/BIOPSY OF POSTERIOR TONGUE  SURGEON:  Beckie Salts, MD  ASSISTANTS: None  ANESTHESIA:   General   EBL:  5 ml  DRAINS: None  LOCAL MEDICATIONS USED: 1% Xylocaine with epinephrine  SPECIMEN: Right lateral tongue lesion  COUNTS:  Correct  PROCEDURE DETAILS: The patient was taken to the operating room and placed on the operating table in the supine position. Following induction of intravenous sedation the tongue was examined and infiltrated with local anesthetic solution.  A 4 mm dermal punch was used to take a biopsy of the most suspicious looking area with some thickening and white changes.  Eyes were then covered with moist and eye pads and wet towels were placed around the face.  The CO2 laser was used with a handpiece at a setting of 2 W continuous power and the entire abnormal mucosal area was treated with the laser.  Suction was used to remove denuded mucosa.  Total area is approximately 1 x 1-1/2 cm.  Patient was then transferred to recovery in stable condition.    PATIENT DISPOSITION:  To PACU, stable

## 2020-03-25 NOTE — Transfer of Care (Deleted)
Immediate Anesthesia Transfer of Care Note  Patient: Joan Boyle  Procedure(s) Performed: CO2 LASER EXCISION W/BIOPSY OF POSTERIOR TONGUE (Right )  Patient Location: PACU  Anesthesia Type:General  Level of Consciousness: sedated  Airway & Oxygen Therapy: Patient Spontanous Breathing and Patient connected to face mask oxygen  Post-op Assessment: Report given to RN and Post -op Vital signs reviewed and stable  Post vital signs: Reviewed and stable  Last Vitals:  Vitals Value Taken Time  BP    Temp    Pulse 75 03/25/20 0800  Resp 13 03/25/20 0800  SpO2 95 % 03/25/20 0800  Vitals shown include unvalidated device data.  Last Pain:  Vitals:   03/25/20 0644  TempSrc: Oral  PainSc: 0-No pain         Complications: No complications documented.

## 2020-03-25 NOTE — Interval H&P Note (Signed)
History and Physical Interval Note:  03/25/2020 7:21 AM  Joan Boyle  has presented today for surgery, with the diagnosis of TONGUE CANCER.  The various methods of treatment have been discussed with the patient and family. After consideration of risks, benefits and other options for treatment, the patient has consented to  Procedure(s): CO2 LASER EXCISION W/BIOPSY OF POSTERIOR TONGUE (Right) as a surgical intervention.  The patient's history has been reviewed, patient examined, no change in status, stable for surgery.  I have reviewed the patient's chart and labs.  Questions were answered to the patient's satisfaction.     [name]

## 2020-03-26 ENCOUNTER — Encounter (HOSPITAL_BASED_OUTPATIENT_CLINIC_OR_DEPARTMENT_OTHER): Payer: Self-pay | Admitting: Otolaryngology

## 2020-03-28 LAB — SURGICAL PATHOLOGY

## 2020-04-10 ENCOUNTER — Ambulatory Visit
Admission: RE | Admit: 2020-04-10 | Discharge: 2020-04-10 | Disposition: A | Discharge status: Home / Self Care | Payer: Commercial Managed Care - PPO | Source: Ambulatory Visit | Attending: Family Medicine | Admitting: Family Medicine

## 2020-04-10 ENCOUNTER — Other Ambulatory Visit: Payer: Self-pay

## 2020-04-10 DIAGNOSIS — Z1231 Encounter for screening mammogram for malignant neoplasm of breast: Secondary | ICD-10-CM

## 2020-06-04 ENCOUNTER — Other Ambulatory Visit: Payer: Commercial Managed Care - PPO

## 2020-06-04 DIAGNOSIS — Z20822 Contact with and (suspected) exposure to covid-19: Secondary | ICD-10-CM

## 2020-06-06 ENCOUNTER — Telehealth: Payer: Self-pay | Admitting: *Deleted

## 2020-06-06 ENCOUNTER — Other Ambulatory Visit: Payer: Commercial Managed Care - PPO

## 2020-06-06 DIAGNOSIS — Z20822 Contact with and (suspected) exposure to covid-19: Secondary | ICD-10-CM

## 2020-06-06 LAB — NOVEL CORONAVIRUS, NAA

## 2020-06-06 NOTE — Telephone Encounter (Signed)
Patient returned call- regarding inconclusive COVID test. Patient had recently traveled and she was testing for screening and possible exposure on trip. Patient has no symptoms and her travel companions have all tested negative. Patient has rapid test she can repeat at home- she will retest if she has symptoms. Does not feel it is necessary at this time.

## 2020-06-07 LAB — SARS-COV-2, NAA 2 DAY TAT

## 2020-06-07 LAB — NOVEL CORONAVIRUS, NAA: SARS-CoV-2, NAA: NOT DETECTED

## 2020-07-23 ENCOUNTER — Other Ambulatory Visit: Payer: Commercial Managed Care - PPO

## 2020-07-23 DIAGNOSIS — Z20822 Contact with and (suspected) exposure to covid-19: Secondary | ICD-10-CM

## 2020-07-25 LAB — NOVEL CORONAVIRUS, NAA: SARS-CoV-2, NAA: NOT DETECTED

## 2020-07-25 LAB — SARS-COV-2, NAA 2 DAY TAT

## 2020-11-14 LAB — SURGICAL PATHOLOGY

## 2021-02-11 DIAGNOSIS — F4322 Adjustment disorder with anxiety: Secondary | ICD-10-CM | POA: Diagnosis not present

## 2021-03-06 ENCOUNTER — Ambulatory Visit: Payer: Self-pay | Attending: Internal Medicine

## 2021-03-06 ENCOUNTER — Other Ambulatory Visit: Payer: Self-pay

## 2021-03-06 DIAGNOSIS — Z23 Encounter for immunization: Secondary | ICD-10-CM

## 2021-03-06 NOTE — Progress Notes (Signed)
   Covid-19 Vaccination Clinic  Name:  Joan Boyle    MRN: [id number] DOB: [birth year]  03/06/2021  [name] was observed post Covid-19 immunization for 15 minutes without incident. She was provided with Vaccine Information Sheet and instruction to access the V-Safe system.   [name] was instructed to call 911 with any severe reactions post vaccine: Difficulty breathing  Swelling of face and throat  A fast heartbeat  A bad rash all over body  Dizziness and weakness   Immunizations Administered     Name Date Dose VIS Date Route   PFIZER Comrnaty(Gray TOP) Covid-19 Vaccine 03/06/2021 11:05 AM 0.3 mL 07/11/2020 Intramuscular   Manufacturer: [place]   Lot: [id number]   [place]: [id number]

## 2021-03-18 DIAGNOSIS — K1321 Leukoplakia of oral mucosa, including tongue: Secondary | ICD-10-CM | POA: Diagnosis not present

## 2021-03-25 DIAGNOSIS — F4322 Adjustment disorder with anxiety: Secondary | ICD-10-CM | POA: Diagnosis not present

## 2021-04-16 DIAGNOSIS — D2262 Melanocytic nevi of left upper limb, including shoulder: Secondary | ICD-10-CM | POA: Diagnosis not present

## 2021-04-16 DIAGNOSIS — D225 Melanocytic nevi of trunk: Secondary | ICD-10-CM | POA: Diagnosis not present

## 2021-04-16 DIAGNOSIS — C44719 Basal cell carcinoma of skin of left lower limb, including hip: Secondary | ICD-10-CM | POA: Diagnosis not present

## 2021-04-16 DIAGNOSIS — D1801 Hemangioma of skin and subcutaneous tissue: Secondary | ICD-10-CM | POA: Diagnosis not present

## 2021-04-16 DIAGNOSIS — L570 Actinic keratosis: Secondary | ICD-10-CM | POA: Diagnosis not present

## 2021-04-16 DIAGNOSIS — L821 Other seborrheic keratosis: Secondary | ICD-10-CM | POA: Diagnosis not present

## 2021-04-16 DIAGNOSIS — L820 Inflamed seborrheic keratosis: Secondary | ICD-10-CM | POA: Diagnosis not present

## 2021-04-23 ENCOUNTER — Other Ambulatory Visit: Payer: Self-pay | Admitting: Family Medicine

## 2021-04-23 DIAGNOSIS — Z1231 Encounter for screening mammogram for malignant neoplasm of breast: Secondary | ICD-10-CM

## 2021-05-15 DIAGNOSIS — C44719 Basal cell carcinoma of skin of left lower limb, including hip: Secondary | ICD-10-CM | POA: Diagnosis not present

## 2021-05-20 DIAGNOSIS — F4322 Adjustment disorder with anxiety: Secondary | ICD-10-CM | POA: Diagnosis not present

## 2021-05-22 ENCOUNTER — Ambulatory Visit
Admission: RE | Admit: 2021-05-22 | Discharge: 2021-05-22 | Disposition: A | Discharge status: Home / Self Care | Payer: Medicare Other | Source: Ambulatory Visit | Attending: Family Medicine | Admitting: Family Medicine

## 2021-05-22 ENCOUNTER — Other Ambulatory Visit: Payer: Self-pay

## 2021-05-22 DIAGNOSIS — Z1231 Encounter for screening mammogram for malignant neoplasm of breast: Secondary | ICD-10-CM

## 2021-06-11 ENCOUNTER — Emergency Department (HOSPITAL_BASED_OUTPATIENT_CLINIC_OR_DEPARTMENT_OTHER): Payer: Medicare Other | Admitting: Radiology

## 2021-06-11 ENCOUNTER — Encounter (HOSPITAL_BASED_OUTPATIENT_CLINIC_OR_DEPARTMENT_OTHER): Payer: Self-pay | Admitting: Emergency Medicine

## 2021-06-11 ENCOUNTER — Emergency Department (HOSPITAL_BASED_OUTPATIENT_CLINIC_OR_DEPARTMENT_OTHER)
Admission: EM | Admit: 2021-06-11 | Discharge: 2021-06-11 | Disposition: A | Discharge status: Home / Self Care | Payer: Medicare Other | Attending: Emergency Medicine | Admitting: Emergency Medicine

## 2021-06-11 ENCOUNTER — Other Ambulatory Visit: Payer: Self-pay

## 2021-06-11 DIAGNOSIS — Z79899 Other long term (current) drug therapy: Secondary | ICD-10-CM | POA: Diagnosis not present

## 2021-06-11 DIAGNOSIS — Y939 Activity, unspecified: Secondary | ICD-10-CM | POA: Diagnosis not present

## 2021-06-11 DIAGNOSIS — R0781 Pleurodynia: Secondary | ICD-10-CM | POA: Diagnosis not present

## 2021-06-11 DIAGNOSIS — Y999 Unspecified external cause status: Secondary | ICD-10-CM | POA: Insufficient documentation

## 2021-06-11 DIAGNOSIS — S4992XA Unspecified injury of left shoulder and upper arm, initial encounter: Secondary | ICD-10-CM | POA: Diagnosis present

## 2021-06-11 DIAGNOSIS — Y929 Unspecified place or not applicable: Secondary | ICD-10-CM | POA: Diagnosis not present

## 2021-06-11 DIAGNOSIS — J45909 Unspecified asthma, uncomplicated: Secondary | ICD-10-CM | POA: Insufficient documentation

## 2021-06-11 DIAGNOSIS — S20212A Contusion of left front wall of thorax, initial encounter: Secondary | ICD-10-CM | POA: Diagnosis not present

## 2021-06-11 DIAGNOSIS — S5012XA Contusion of left forearm, initial encounter: Secondary | ICD-10-CM

## 2021-06-11 DIAGNOSIS — M25532 Pain in left wrist: Secondary | ICD-10-CM | POA: Diagnosis not present

## 2021-06-11 DIAGNOSIS — W1839XA Other fall on same level, initial encounter: Secondary | ICD-10-CM | POA: Diagnosis not present

## 2021-06-11 DIAGNOSIS — Z91040 Latex allergy status: Secondary | ICD-10-CM | POA: Insufficient documentation

## 2021-06-11 DIAGNOSIS — S20211A Contusion of right front wall of thorax, initial encounter: Secondary | ICD-10-CM

## 2021-06-11 NOTE — ED Provider Notes (Signed)
[facility] Provider Note   CSN: [id number] Arrival date & time: 06/11/21  3893     History No chief complaint on file.   Joan Boyle is a 65 y.o. female.  Patient with asthma history presents with right rib pain and left arm pain since fall yesterday while walking.  No other significant injuries.  Pain with pressure and movement.  No history of injuries to those areas.      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    Past Surgical History:  Procedure Laterality Date   EXCISION OF TONGUE LESION WITH LASER  2011   EXCISION OF TONGUE LESION WITH LASER Right 05/29/2019   Procedure: Excision Right Tongue Lesion w/CO2 laser, Frozen section;  Surgeon: [name], MD;  Location: [facility];  Service: ENT;  Laterality: Right;   EXCISION ORAL LESION WITH CO2 LASER Right 03/25/2020   Procedure: CO2 LASER EXCISION W/BIOPSY OF POSTERIOR TONGUE;  Surgeon: [name], MD;  Location: [facility];  Service: ENT;  Laterality: Right;   GLOSSECTOMY Right 06/21/2019   Procedure: GLOSSECTOMY  Partial;  Surgeon: [name], MD;  Location: [facility];  Service: ENT;  Laterality: Right;   MINOR C02 LASER EXCISION OF ORAL LESION Right 12/11/2013   Procedure: CO2 LASER ABLATION RIGHT SIDE OF TONGUE   (MINOR PROCEDURE) ;  Surgeon: [name], MD;  Location: [facility];  Service: ENT;  Laterality: Right;   SALIVARY STONE REMOVAL  08/01/2012   Procedure: SALIVARY STONE REMOVAL;  Surgeon: [name], MD;  Location: [facility];  Service: ENT;  Laterality: Bilateral;  FLOOR OF MOUTH EXCISION POSSIBLE SUBMANDIBULAR DUCT RE-ROUTE   WISDOM TOOTH EXTRACTION       OB History   No  obstetric history on file.     No family history on file.  Social History   Tobacco Use   Smoking status: Never   Smokeless tobacco: Never  Vaping Use   Vaping Use: Never used  Substance Use Topics   Alcohol use: Yes    Comment: occasionally   Drug use: No    Home Medications Prior to Admission medications   Medication Sig Start Date End Date Taking? Authorizing Provider  albuterol (PROVENTIL HFA;VENTOLIN HFA) 108 (90 BASE) MCG/ACT inhaler Inhale into the lungs every 6 (six) hours as needed for wheezing or shortness of breath.    [provider]  calcium-vitamin D (OSCAL WITH D) 500-200 MG-UNIT per tablet Take 1 tablet by mouth daily with breakfast.     [provider]  cholecalciferol (VITAMIN D3) 25 MCG (1000 UT) tablet Take 1,000 Units by mouth daily.    [provider]  Coenzyme Q10 150 MG CAPS Take 200 mg by mouth daily.     [provider]  EPINEPHrine (EPIPEN) 0.3 mg/0.3 mL IJ SOAJ injection Inject into the muscle once.    [provider]  famotidine (PEPCID) 20 MG tablet Take 20 mg by mouth at bedtime.    [provider]  Multiple Vitamins-Minerals (AIRBORNE PO) Take 1 tablet by mouth daily.     [provider]  triamcinolone (KENALOG) 0.1 % paste Use as directed 1 application in the mouth  or throat 2 (two) times daily as needed (irritation).    [provider]    Allergies    Penicillins, Latex, and Toradol [ketorolac tromethamine]  Review of Systems   Review of Systems  Constitutional:  Negative for fever.  HENT:  Negative for congestion.   Respiratory:  Negative for shortness of breath.   Cardiovascular:  Negative for chest pain.  Gastrointestinal:  Negative for vomiting.  Musculoskeletal:  Negative for back pain.  Skin:  Negative for rash.  Neurological:  Negative for weakness.   Physical Exam Updated Vital Signs BP (!) 157/91 (BP Location: Right Arm)   Pulse 76   Temp 98.7 F (37.1  C) (Oral)   Resp 14   Ht 5\' 4"  (1.626 m)   Wt 62.6 kg   SpO2 100%   BMI 23.69 kg/m   Physical Exam Vitals and nursing note reviewed.  Constitutional:      General: She is not in acute distress.    Appearance: She is well-developed.  HENT:     Head: Normocephalic and atraumatic.     Mouth/Throat:     Mouth: Mucous membranes are moist.  Eyes:     General:        Right eye: No discharge.        Left eye: No discharge.     Conjunctiva/sclera: Conjunctivae normal.  Neck:     Trachea: No tracheal deviation.  Cardiovascular:     Rate and Rhythm: Normal rate.  Pulmonary:     Effort: Pulmonary effort is normal.  Abdominal:     General: There is no distension.     Tenderness: There is no abdominal tenderness.  Musculoskeletal:        General: Tenderness present. No swelling.     Cervical back: Normal range of motion.     Comments: Patient has mild tenderness to lateral aspect of distal left forearm without deformity.  No carpal tenderness on palpation, no finger tenderness and full extension of fingers without difficulty.  No proximal forearm or elbow tenderness.  Skin:    General: Skin is warm.     Capillary Refill: Capillary refill takes less than 2 seconds.     Findings: No rash.  Neurological:     General: No focal deficit present.     Mental Status: She is alert.     Cranial Nerves: No cranial nerve deficit.  Psychiatric:        Mood and Affect: Mood normal.    ED Results / Procedures / Treatments   Labs (all labs ordered are listed, but only abnormal results are displayed) Labs Reviewed - No data to display  EKG None  Radiology No results found.  Procedures Procedures   Medications Ordered in ED Medications - No data to display  ED Course  I have reviewed the triage vital signs and the nursing notes.  Pertinent labs & imaging results that were available during my care of the patient were reviewed by me and considered in my medical decision making (see  chart for details).    MDM Rules/Calculators/A&P                           Patient presents with isolated rib and forearm injuries.  X-rays ordered and reviewed no acute fracture.  Supportive care discussed and outpatient follow-up.    Final Clinical Impression(s) / ED Diagnoses Final diagnoses:  Rib contusion, right, initial encounter  Contusion of left forearm, initial  encounter    Rx / DC Orders ED Discharge Orders     None        Elnora Morrison, MD 06/11/21 [phone]

## 2021-06-11 NOTE — Discharge Instructions (Addendum)
Use ice, Tylenol and ibuprofen as needed for pain. Your x-rays did not show any fractures. Gradually increase range of motion as tolerated.

## 2021-06-11 NOTE — ED Triage Notes (Signed)
Pt from home and fell while walking the dog. Pt c/o pain to left wrist and and forearm.

## 2021-06-19 DIAGNOSIS — K1321 Leukoplakia of oral mucosa, including tongue: Secondary | ICD-10-CM | POA: Diagnosis not present

## 2021-06-23 ENCOUNTER — Other Ambulatory Visit: Payer: Self-pay

## 2021-06-23 ENCOUNTER — Encounter (HOSPITAL_COMMUNITY): Payer: Self-pay | Admitting: Otolaryngology

## 2021-06-23 NOTE — Progress Notes (Signed)
Javaria Knapke denies chest pain or shortness of breath.  Patient denies having any s/s of Covid in her household.  Patient denies any known exposure to Covid..  Mrs. Burgio's PCP is Dr. Kathyrn Lass.  I instructed patient to shower with antibiotic soap, if it is available.  Dry off with a clean towel. Do not put lotion, powder, cologne or deodorant or makeup.No jewelry or piercings. Men may shave their face and neck. Woman should not shave. No nail polish, artificial or acrylic nails. Wear clean clothes, brush your teeth. Glasses, contact lens,dentures or partials may not be worn in the OR. If you need to wear them, please bring a case for glasses, do not wear contacts or bring a case, the hospital does not have contact cases, dentures or partials will have to be removed , make sure they are clean, we will provide a denture cup to put them in. You will need some one to drive you home and a responsible person over the age of 2 to stay with you for the first 24 hours after surgery.

## 2021-06-24 NOTE — Anesthesia Preprocedure Evaluation (Addendum)
Anesthesia Evaluation  Patient identified by MRN, date of birth, ID band Patient awake    Reviewed: Allergy & Precautions, NPO status , Patient's Chart, lab work & pertinent test results  History of Anesthesia Complications Negative for: history of anesthetic complications  Airway Mallampati: III  TM Distance: >3 FB     Dental no notable dental hx. (+) Dental Advisory Given   Pulmonary asthma ,    Pulmonary exam normal        Cardiovascular negative cardio ROS Normal cardiovascular exam     Neuro/Psych negative neurological ROS  negative psych ROS   GI/Hepatic Neg liver ROS, GERD  Medicated and Controlled,  Endo/Other  negative endocrine ROS  Renal/GU negative Renal ROS     Musculoskeletal negative musculoskeletal ROS (+)   Abdominal   Peds  Hematology negative hematology ROS (+)   Anesthesia Other Findings TONGUE CANCER  Reproductive/Obstetrics                            Anesthesia Physical  Anesthesia Plan  ASA: 2  Anesthesia Plan: MAC   Post-op Pain Management: Tylenol PO (pre-op)   Induction: Intravenous  PONV Risk Score and Plan: 3 and Ondansetron, Dexamethasone, Midazolam, Treatment may vary due to age or medical condition and Propofol infusion  Airway Management Planned: Natural Airway  Additional Equipment:   Intra-op Plan:   Post-operative Plan:   Informed Consent: I have reviewed the patients History and Physical, chart, labs and discussed the procedure including the risks, benefits and alternatives for the proposed anesthesia with the patient or authorized representative who has indicated his/her understanding and acceptance.     Dental advisory given  Plan Discussed with: Anesthesiologist, CRNA and Surgeon  Anesthesia Plan Comments:      Anesthesia Quick Evaluation

## 2021-06-24 NOTE — Progress Notes (Signed)
Patient voiced understanding of new arrival time of 26.

## 2021-06-25 ENCOUNTER — Ambulatory Visit (HOSPITAL_COMMUNITY)
Admission: RE | Admit: 2021-06-25 | Discharge: 2021-06-25 | Disposition: A | Discharge status: Home / Self Care | Payer: Medicare Other | Attending: Otolaryngology | Admitting: Otolaryngology

## 2021-06-25 ENCOUNTER — Ambulatory Visit (HOSPITAL_COMMUNITY): Payer: Medicare Other | Admitting: Anesthesiology

## 2021-06-25 ENCOUNTER — Encounter (HOSPITAL_COMMUNITY)
Admission: RE | Disposition: A | Discharge status: Home / Self Care | Payer: Self-pay | Source: Home / Self Care | Attending: Otolaryngology

## 2021-06-25 ENCOUNTER — Other Ambulatory Visit: Payer: Self-pay

## 2021-06-25 ENCOUNTER — Encounter (HOSPITAL_COMMUNITY): Payer: Self-pay | Admitting: Otolaryngology

## 2021-06-25 DIAGNOSIS — K1321 Leukoplakia of oral mucosa, including tongue: Secondary | ICD-10-CM | POA: Diagnosis not present

## 2021-06-25 DIAGNOSIS — Z85810 Personal history of malignant neoplasm of tongue: Secondary | ICD-10-CM | POA: Diagnosis not present

## 2021-06-25 DIAGNOSIS — L859 Epidermal thickening, unspecified: Secondary | ICD-10-CM | POA: Diagnosis not present

## 2021-06-25 DIAGNOSIS — K219 Gastro-esophageal reflux disease without esophagitis: Secondary | ICD-10-CM | POA: Diagnosis not present

## 2021-06-25 DIAGNOSIS — C029 Malignant neoplasm of tongue, unspecified: Secondary | ICD-10-CM | POA: Diagnosis not present

## 2021-06-25 HISTORY — PX: EXCISION OF TONGUE LESION WITH LASER: SHX5823

## 2021-06-25 LAB — CBC
HCT: 43.4 % (ref 36.0–46.0)
Hemoglobin: 14.1 g/dL (ref 12.0–15.0)
MCH: 30.8 pg (ref 26.0–34.0)
MCHC: 32.5 g/dL (ref 30.0–36.0)
MCV: 94.8 fL (ref 80.0–100.0)
Platelets: 166 10*3/uL (ref 150–400)
RBC: 4.58 MIL/uL (ref 3.87–5.11)
RDW: 12.7 % (ref 11.5–15.5)
WBC: 4.6 10*3/uL (ref 4.0–10.5)
nRBC: 0 % (ref 0.0–0.2)

## 2021-06-25 SURGERY — EXCISION, LESION, TONGUE, USING LASER
Anesthesia: Monitor Anesthesia Care | Site: Mouth

## 2021-06-25 MED ORDER — FENTANYL CITRATE (PF) 250 MCG/5ML IJ SOLN
INTRAMUSCULAR | Status: AC
  Filled 2021-06-25: qty 5

## 2021-06-25 MED ORDER — ACETAMINOPHEN 500 MG PO TABS
1000.0000 mg | ORAL_TABLET | Freq: Once | ORAL | Status: AC
  Administered 2021-06-25: 1000 mg via ORAL
  Filled 2021-06-25: qty 2

## 2021-06-25 MED ORDER — CHLORHEXIDINE GLUCONATE 0.12 % MT SOLN
15.0000 mL | Freq: Once | OROMUCOSAL | Status: AC
  Administered 2021-06-25: 15 mL via OROMUCOSAL
  Filled 2021-06-25: qty 15

## 2021-06-25 MED ORDER — HYDROCODONE-ACETAMINOPHEN 7.5-325 MG PO TABS: 1.0000 | ORAL_TABLET | Freq: Four times a day (QID) | ORAL | 0 refills | Status: DC | PRN

## 2021-06-25 MED ORDER — LIDOCAINE-EPINEPHRINE 1 %-1:100000 IJ SOLN
INTRAMUSCULAR | Status: AC
  Filled 2021-06-25: qty 1

## 2021-06-25 MED ORDER — MIDAZOLAM HCL 2 MG/2ML IJ SOLN
INTRAMUSCULAR | Status: AC
  Filled 2021-06-25: qty 2

## 2021-06-25 MED ORDER — PROPOFOL 500 MG/50ML IV EMUL
INTRAVENOUS | Status: DC | PRN
  Administered 2021-06-25: 100 ug/kg/min via INTRAVENOUS

## 2021-06-25 MED ORDER — PROPOFOL 10 MG/ML IV BOLUS
INTRAVENOUS | Status: AC
  Filled 2021-06-25: qty 20

## 2021-06-25 MED ORDER — ONDANSETRON HCL 4 MG/2ML IJ SOLN
INTRAMUSCULAR | Status: DC | PRN
  Administered 2021-06-25: 4 mg via INTRAVENOUS

## 2021-06-25 MED ORDER — LIDOCAINE-EPINEPHRINE 1 %-1:100000 IJ SOLN
INTRAMUSCULAR | Status: DC | PRN
  Administered 2021-06-25: 4 mL

## 2021-06-25 MED ORDER — ORAL CARE MOUTH RINSE: 15.0000 mL | Freq: Once | OROMUCOSAL | Status: AC

## 2021-06-25 MED ORDER — LACTATED RINGERS IV SOLN: INTRAVENOUS | Status: DC

## 2021-06-25 MED ORDER — ONDANSETRON HCL 4 MG/2ML IJ SOLN
INTRAMUSCULAR | Status: AC
  Filled 2021-06-25: qty 2

## 2021-06-25 MED ORDER — MIDAZOLAM HCL 5 MG/5ML IJ SOLN
INTRAMUSCULAR | Status: DC | PRN
  Administered 2021-06-25: 2 mg via INTRAVENOUS

## 2021-06-25 MED ORDER — FENTANYL CITRATE (PF) 250 MCG/5ML IJ SOLN
INTRAMUSCULAR | Status: DC | PRN
  Administered 2021-06-25 (×2): 25 ug via INTRAVENOUS
  Administered 2021-06-25: 50 ug via INTRAVENOUS

## 2021-06-25 MED ORDER — PHENYLEPHRINE 40 MCG/ML (10ML) SYRINGE FOR IV PUSH (FOR BLOOD PRESSURE SUPPORT)
PREFILLED_SYRINGE | INTRAVENOUS | Status: AC
  Filled 2021-06-25: qty 10

## 2021-06-25 MED ORDER — LIDOCAINE 2% (20 MG/ML) 5 ML SYRINGE
INTRAMUSCULAR | Status: DC | PRN
  Administered 2021-06-25: 60 mg via INTRAVENOUS

## 2021-06-25 SURGICAL SUPPLY — 23 items
BAG COUNTER SPONGE SURGICOUNT (BAG) ×2 IMPLANT
BLADE SURG 15 STRL LF DISP TIS (BLADE) ×1 IMPLANT
BLADE SURG 15 STRL SS (BLADE) ×2
CANISTER SUCT 3000ML PPV (MISCELLANEOUS) ×2 IMPLANT
COVER BACK TABLE 60X90IN (DRAPES) ×2 IMPLANT
DEPRESSOR TONGUE BLADE STERILE (MISCELLANEOUS) ×2 IMPLANT
DRAPE HALF SHEET 40X57 (DRAPES) ×2 IMPLANT
GAUZE SPONGE 4X4 12PLY STRL (GAUZE/BANDAGES/DRESSINGS) IMPLANT
GLOVE SURG LTX SZ7.5 (GLOVE) IMPLANT
GUARD TEETH (MISCELLANEOUS) ×2 IMPLANT
KIT BASIN OR (CUSTOM PROCEDURE TRAY) ×2 IMPLANT
KIT TURNOVER KIT B (KITS) ×2 IMPLANT
NEEDLE PRECISIONGLIDE 27X1.5 (NEEDLE) ×2 IMPLANT
NS IRRIG 1000ML POUR BTL (IV SOLUTION) ×2 IMPLANT
PAD ARMBOARD 7.5X6 YLW CONV (MISCELLANEOUS) IMPLANT
PUNCH BIOPSY 4MM (MISCELLANEOUS) ×2
PUNCH BIOPSY DISP 4 (MISCELLANEOUS) ×1 IMPLANT
SUCTION FRAZIER TIP 8 FR DISP (SUCTIONS) ×2
SUCTION TUBE FRAZIER 8FR DISP (SUCTIONS) ×1 IMPLANT
SYR CONTROL 10ML LL (SYRINGE) ×2 IMPLANT
SYR TB 1ML LUER SLIP (SYRINGE) IMPLANT
TOWEL GREEN STERILE FF (TOWEL DISPOSABLE) ×2 IMPLANT
TUBE CONNECTING 12X1/4 (SUCTIONS) ×2 IMPLANT

## 2021-06-25 NOTE — H&P (Signed)
Joan Boyle is an 65 y.o. female.   Chief Complaint: Oral lesion HPI: History of cancer of the tongue, history of multiple oral lesions that have been laser excised and biopsied.  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   GERD (gastroesophageal reflux disease)     Past Surgical History:  Procedure Laterality Date   EXCISION OF TONGUE LESION WITH LASER  2011   EXCISION OF TONGUE LESION WITH LASER Right 05/29/2019   Procedure: Excision Right Tongue Lesion w/CO2 laser, Frozen section;  Surgeon: [name], MD;  Location: [facility];  Service: ENT;  Laterality: Right;   EXCISION ORAL LESION WITH CO2 LASER Right 03/25/2020   Procedure: CO2 LASER EXCISION W/BIOPSY OF POSTERIOR TONGUE;  Surgeon: [name], MD;  Location: [facility];  Service: ENT;  Laterality: Right;   GLOSSECTOMY Right 06/21/2019   Procedure: GLOSSECTOMY  Partial;  Surgeon: [name], MD;  Location: [facility];  Service: ENT;  Laterality: Right;   MINOR C02 LASER EXCISION OF ORAL LESION Right 12/11/2013   Procedure: CO2 LASER ABLATION RIGHT SIDE OF TONGUE   (MINOR PROCEDURE) ;  Surgeon: [name], MD;  Location: [facility];  Service: ENT;  Laterality: Right;   SALIVARY STONE REMOVAL  08/01/2012   Procedure: SALIVARY STONE REMOVAL;  Surgeon: [name], MD;  Location: [facility];  Service: ENT;  Laterality: Bilateral;  FLOOR OF MOUTH EXCISION POSSIBLE SUBMANDIBULAR DUCT RE-ROUTE   WISDOM TOOTH EXTRACTION      History reviewed. No pertinent family history. Social History:  reports that she has never smoked. She has never used smokeless tobacco. She reports current alcohol use. She reports that she does not use drugs.  Allergies:  Allergies  Allergen Reactions   Penicillins Rash and Other (See Comments)    Did it involve swelling of the  face/tongue/throat, SOB, or low BP? YES Did it involve sudden or severe rash/hives, skin peeling, or any reaction on the inside of your mouth or nose? Yes Did you need to seek medical attention at a hospital or doctor's office? Yes When did it last happen?      childhood    Latex Swelling    SWELLING OF EYES NASAL/CHEST CONGESTION   Toradol [Ketorolac Tromethamine] Rash    Medications Prior to Admission  Medication Sig Dispense Refill   acetaminophen (TYLENOL) 500 MG tablet Take 500 mg by mouth every 6 (six) hours as needed (pain/headaches.).     albuterol (PROVENTIL HFA;VENTOLIN HFA) 108 (90 BASE) MCG/ACT inhaler Inhale into the lungs every 6 (six) hours as needed for wheezing or shortness of breath.     Calcium Carb-Cholecalciferol (CALTRATE 600+D3 PO) Take 1 tablet by mouth in the morning.     cholecalciferol (VITAMIN D) 25 MCG (1000 UNIT) tablet Take 1,000 Units by mouth in the morning.     Coenzyme Q10 (COQ10) 200 MG CAPS Take 200 mg by mouth in the morning.     EPINEPHrine 0.3 mg/0.3 mL IJ SOAJ injection Inject 0.3 mg into the muscle as needed for anaphylaxis.     famotidine (PEPCID) 20 MG tablet Take 20 mg by mouth at bedtime.     ibuprofen (ADVIL) 200 MG tablet Take 400 mg by mouth daily as needed (headaches.).     Multiple Vitamins-Minerals (AIRBORNE PO) Take 1 tablet by mouth  in the morning.     rosuvastatin (CRESTOR) 5 MG tablet Take 5 mg by mouth in the morning.     tretinoin (RETIN-A) 0.025 % cream Apply 1 application topically every three (3) days as needed (skin blemishes).     triamcinolone (KENALOG) 0.1 % paste Use as directed 1 application in the mouth or throat 2 (two) times daily as needed (irritation).      Results for orders placed or performed during the hospital encounter of 06/25/21 (from the past 48 hour(s))  CBC per protocol     Status: None   Collection Time: 06/25/21  7:25 AM  Result Value Ref Range   WBC 4.6 4.0 - 10.5 K/uL   RBC 4.58 3.87 - 5.11 MIL/uL    Hemoglobin 14.1 12.0 - 15.0 g/dL   HCT 43.4 36.0 - 46.0 %   MCV 94.8 80.0 - 100.0 fL   MCH 30.8 26.0 - 34.0 pg   MCHC 32.5 30.0 - 36.0 g/dL   RDW 12.7 11.5 - 15.5 %   Platelets 166 150 - 400 K/uL   nRBC 0.0 0.0 - 0.2 %    Comment: Performed at [facility], [place] [address], [place], [place] [address]   No results found.  ROS: otherwise negative  Blood pressure 139/78, pulse 75, temperature 97.6 F (36.4 C), temperature source Oral, resp. rate 18, height 5\' 4"  (1.626 m), weight 62.1 kg, SpO2 100 %.  PHYSICAL EXAM: Overall appearance:  Healthy appearing, in no distress Head:  Normocephalic, atraumatic. Ears: External auditory canals are clear; tympanic membranes are intact and the middle ears are free of any effusion. Nose: External nose is healthy in appearance. Internal nasal exam free of any lesions or obstruction. Oral Cavity/pharynx: Mucosal thickening and patches, leukoplakia along the right lateral tongue and floor of mouth.  No other  mucosal lesions or masses identified. Hypopharynx/Larynx: no signs of any mucosal lesions or masses identified. Vocal cords move normally. Neuro:  No identifiable neurologic deficits. Neck: No palpable neck masses.  Studies Reviewed: none    Assessment/Plan New thickening right floor of mouth/tongue leukoplakia.  Recommend biopsy and laser excision.  [name] 06/25/2021, 8:31 AM

## 2021-06-25 NOTE — Transfer of Care (Signed)
Immediate Anesthesia Transfer of Care Note  Patient: Joan Boyle  Procedure(s) Performed: ORAL CAVITY BIOPSY WITH CO2 LASER APPLICATION (Mouth)  Patient Location: PACU  Anesthesia Type:MAC  Level of Consciousness: awake, alert , oriented and patient cooperative  Airway & Oxygen Therapy: Patient Spontanous Breathing and Patient connected to nasal cannula oxygen  Post-op Assessment: Report given to RN, Post -op Vital signs reviewed and stable and Patient moving all extremities  Post vital signs: Reviewed and stable  Last Vitals:  Vitals Value Taken Time  BP 132/70 06/25/21 0957  Temp 36.9 C 06/25/21 0957  Pulse 73 06/25/21 1002  Resp 16 06/25/21 1002  SpO2 97 % 06/25/21 1002  Vitals shown include unvalidated device data.  Last Pain:  Vitals:   06/25/21 0957  TempSrc:   PainSc: 0-No pain      Patients Stated Pain Goal: 1 (61/44/31 5400)  Complications: No notable events documented.

## 2021-06-25 NOTE — Op Note (Signed)
OPERATIVE REPORT  DATE OF SURGERY: 06/25/2021  PATIENT:  Joan Boyle,  65 y.o. female  PRE-OPERATIVE DIAGNOSIS:  Leukoplakia oral mucosa; Tongue cancer  POST-OPERATIVE DIAGNOSIS:  Leukoplakia oral mucosa; Tongue cancer  PROCEDURE:  Procedure(s): ORAL CAVITY BIOPSY WITH CO2 LASER APPLICATION  SURGEON:  Beckie Salts, MD  ASSISTANTS: None  ANESTHESIA:   IV sedation with MAC  EBL: Less than 5 ml  DRAINS: None  LOCAL MEDICATIONS USED: 1% Xylocaine with epinephrine  SPECIMEN: Right posterior floor of mouth biopsy  COUNTS:  Correct  PROCEDURE DETAILS: The patient was taken to the operating room and placed on the operating table in the supine position. Following induction of intravenous sedation, local anesthesia was infiltrated along the right lateral tongue and floor of mouth.  There is a 3 to 4 mm area of hypertrophic epithelium along the posterior medial floor of mouth adjacent to the tongue, this area was biopsied with a 4 mm punch and sent in its entirety for pathologic evaluation.  The CO2 laser was then used with a handpiece 3 W continuous power to ablate the base of this lesion and the adjacent leukoplakia streaking anteriorly from that.  No other abnormalities were identified.  She tolerated this well.  The chart was suctioned off.  Patient was transferred to recovery in stable condition.     PATIENT DISPOSITION:  To PACU, stable

## 2021-06-25 NOTE — Anesthesia Postprocedure Evaluation (Signed)
Anesthesia Post Note  Patient: Joan Boyle  Procedure(s) Performed: ORAL CAVITY BIOPSY WITH CO2 LASER APPLICATION (Mouth)     Patient location during evaluation: PACU Anesthesia Type: MAC Level of consciousness: awake and alert Pain management: pain level controlled Vital Signs Assessment: post-procedure vital signs reviewed and stable Respiratory status: spontaneous breathing and respiratory function stable Cardiovascular status: stable Postop Assessment: no apparent nausea or vomiting Anesthetic complications: no   No notable events documented.  Last Vitals:  Vitals:   06/25/21 1012 06/25/21 1015  BP: 127/63 131/69  Pulse: 66 72  Resp: 19 19  Temp:  36.7 C  SpO2: 98% 99%    Last Pain:  Vitals:   06/25/21 1015  TempSrc:   PainSc: 0-No pain                 Itzamara Casas DANIEL

## 2021-06-25 NOTE — Discharge Instructions (Signed)
Resume diet as tolerated

## 2021-06-26 ENCOUNTER — Encounter (HOSPITAL_COMMUNITY): Payer: Self-pay | Admitting: Otolaryngology

## 2021-06-27 LAB — SURGICAL PATHOLOGY

## 2021-07-15 DIAGNOSIS — F4322 Adjustment disorder with anxiety: Secondary | ICD-10-CM | POA: Diagnosis not present

## 2021-08-05 DIAGNOSIS — J029 Acute pharyngitis, unspecified: Secondary | ICD-10-CM | POA: Diagnosis not present

## 2021-08-05 DIAGNOSIS — Z03818 Encounter for observation for suspected exposure to other biological agents ruled out: Secondary | ICD-10-CM | POA: Diagnosis not present

## 2021-08-27 DIAGNOSIS — F4322 Adjustment disorder with anxiety: Secondary | ICD-10-CM | POA: Diagnosis not present

## 2021-09-01 DIAGNOSIS — I700 Atherosclerosis of aorta: Secondary | ICD-10-CM | POA: Diagnosis not present

## 2021-09-01 DIAGNOSIS — Z79899 Other long term (current) drug therapy: Secondary | ICD-10-CM | POA: Diagnosis not present

## 2021-09-17 DIAGNOSIS — F4322 Adjustment disorder with anxiety: Secondary | ICD-10-CM | POA: Diagnosis not present

## 2021-09-17 DIAGNOSIS — J45990 Exercise induced bronchospasm: Secondary | ICD-10-CM | POA: Diagnosis not present

## 2021-09-17 DIAGNOSIS — I700 Atherosclerosis of aorta: Secondary | ICD-10-CM | POA: Diagnosis not present

## 2021-09-17 DIAGNOSIS — K219 Gastro-esophageal reflux disease without esophagitis: Secondary | ICD-10-CM | POA: Diagnosis not present

## 2021-09-17 DIAGNOSIS — Z0000 Encounter for general adult medical examination without abnormal findings: Secondary | ICD-10-CM | POA: Diagnosis not present

## 2021-09-24 DIAGNOSIS — Z6825 Body mass index (BMI) 25.0-25.9, adult: Secondary | ICD-10-CM | POA: Diagnosis not present

## 2021-09-24 DIAGNOSIS — Z01419 Encounter for gynecological examination (general) (routine) without abnormal findings: Secondary | ICD-10-CM | POA: Diagnosis not present

## 2021-09-25 DIAGNOSIS — K1321 Leukoplakia of oral mucosa, including tongue: Secondary | ICD-10-CM | POA: Diagnosis not present

## 2021-10-08 DIAGNOSIS — F4322 Adjustment disorder with anxiety: Secondary | ICD-10-CM | POA: Diagnosis not present

## 2021-11-05 DIAGNOSIS — F4322 Adjustment disorder with anxiety: Secondary | ICD-10-CM | POA: Diagnosis not present

## 2021-12-08 DIAGNOSIS — F4322 Adjustment disorder with anxiety: Secondary | ICD-10-CM | POA: Diagnosis not present

## 2021-12-23 DIAGNOSIS — C029 Malignant neoplasm of tongue, unspecified: Secondary | ICD-10-CM | POA: Diagnosis not present

## 2021-12-23 DIAGNOSIS — K1321 Leukoplakia of oral mucosa, including tongue: Secondary | ICD-10-CM | POA: Diagnosis not present

## 2021-12-24 DIAGNOSIS — H35371 Puckering of macula, right eye: Secondary | ICD-10-CM | POA: Diagnosis not present

## 2021-12-24 DIAGNOSIS — H52203 Unspecified astigmatism, bilateral: Secondary | ICD-10-CM | POA: Diagnosis not present

## 2021-12-24 DIAGNOSIS — H25013 Cortical age-related cataract, bilateral: Secondary | ICD-10-CM | POA: Diagnosis not present

## 2021-12-24 DIAGNOSIS — H2513 Age-related nuclear cataract, bilateral: Secondary | ICD-10-CM | POA: Diagnosis not present

## 2021-12-30 DIAGNOSIS — F4322 Adjustment disorder with anxiety: Secondary | ICD-10-CM | POA: Diagnosis not present

## 2022-03-03 DIAGNOSIS — F4322 Adjustment disorder with anxiety: Secondary | ICD-10-CM | POA: Diagnosis not present

## 2022-03-26 DIAGNOSIS — Z85810 Personal history of malignant neoplasm of tongue: Secondary | ICD-10-CM | POA: Diagnosis not present

## 2022-03-26 DIAGNOSIS — H6121 Impacted cerumen, right ear: Secondary | ICD-10-CM | POA: Diagnosis not present

## 2022-04-01 DIAGNOSIS — F4322 Adjustment disorder with anxiety: Secondary | ICD-10-CM | POA: Diagnosis not present

## 2022-04-07 DIAGNOSIS — R21 Rash and other nonspecific skin eruption: Secondary | ICD-10-CM | POA: Diagnosis not present

## 2022-04-07 DIAGNOSIS — S8011XA Contusion of right lower leg, initial encounter: Secondary | ICD-10-CM | POA: Diagnosis not present

## 2022-05-04 ENCOUNTER — Other Ambulatory Visit: Payer: Self-pay | Admitting: Family Medicine

## 2022-05-04 DIAGNOSIS — Z1231 Encounter for screening mammogram for malignant neoplasm of breast: Secondary | ICD-10-CM

## 2022-05-04 DIAGNOSIS — F4322 Adjustment disorder with anxiety: Secondary | ICD-10-CM | POA: Diagnosis not present

## 2022-05-11 DIAGNOSIS — L821 Other seborrheic keratosis: Secondary | ICD-10-CM | POA: Diagnosis not present

## 2022-05-11 DIAGNOSIS — D2262 Melanocytic nevi of left upper limb, including shoulder: Secondary | ICD-10-CM | POA: Diagnosis not present

## 2022-05-11 DIAGNOSIS — D2261 Melanocytic nevi of right upper limb, including shoulder: Secondary | ICD-10-CM | POA: Diagnosis not present

## 2022-05-11 DIAGNOSIS — L918 Other hypertrophic disorders of the skin: Secondary | ICD-10-CM | POA: Diagnosis not present

## 2022-05-11 DIAGNOSIS — D1801 Hemangioma of skin and subcutaneous tissue: Secondary | ICD-10-CM | POA: Diagnosis not present

## 2022-05-29 ENCOUNTER — Ambulatory Visit
Admission: RE | Admit: 2022-05-29 | Discharge: 2022-05-29 | Disposition: A | Discharge status: Home / Self Care | Payer: Medicare Other | Source: Ambulatory Visit | Attending: Family Medicine | Admitting: Family Medicine

## 2022-05-29 DIAGNOSIS — Z1231 Encounter for screening mammogram for malignant neoplasm of breast: Secondary | ICD-10-CM

## 2022-06-08 DIAGNOSIS — F4322 Adjustment disorder with anxiety: Secondary | ICD-10-CM | POA: Diagnosis not present

## 2022-06-09 DIAGNOSIS — C029 Malignant neoplasm of tongue, unspecified: Secondary | ICD-10-CM | POA: Diagnosis not present

## 2022-06-09 DIAGNOSIS — K1321 Leukoplakia of oral mucosa, including tongue: Secondary | ICD-10-CM | POA: Diagnosis not present

## 2022-08-04 DIAGNOSIS — K1321 Leukoplakia of oral mucosa, including tongue: Secondary | ICD-10-CM | POA: Diagnosis not present

## 2022-08-12 DIAGNOSIS — F4322 Adjustment disorder with anxiety: Secondary | ICD-10-CM | POA: Diagnosis not present

## 2022-09-10 DIAGNOSIS — U071 COVID-19: Secondary | ICD-10-CM | POA: Diagnosis not present

## 2022-09-16 DIAGNOSIS — F4322 Adjustment disorder with anxiety: Secondary | ICD-10-CM | POA: Diagnosis not present

## 2022-09-23 DIAGNOSIS — Z0000 Encounter for general adult medical examination without abnormal findings: Secondary | ICD-10-CM | POA: Diagnosis not present

## 2022-09-23 DIAGNOSIS — Z6824 Body mass index (BMI) 24.0-24.9, adult: Secondary | ICD-10-CM | POA: Diagnosis not present

## 2022-09-30 DIAGNOSIS — K219 Gastro-esophageal reflux disease without esophagitis: Secondary | ICD-10-CM | POA: Diagnosis not present

## 2022-09-30 DIAGNOSIS — Z91030 Bee allergy status: Secondary | ICD-10-CM | POA: Diagnosis not present

## 2022-09-30 DIAGNOSIS — Z6823 Body mass index (BMI) 23.0-23.9, adult: Secondary | ICD-10-CM | POA: Diagnosis not present

## 2022-09-30 DIAGNOSIS — Z85810 Personal history of malignant neoplasm of tongue: Secondary | ICD-10-CM | POA: Diagnosis not present

## 2022-09-30 DIAGNOSIS — Z136 Encounter for screening for cardiovascular disorders: Secondary | ICD-10-CM | POA: Diagnosis not present

## 2022-09-30 DIAGNOSIS — I700 Atherosclerosis of aorta: Secondary | ICD-10-CM | POA: Diagnosis not present

## 2022-09-30 DIAGNOSIS — J45990 Exercise induced bronchospasm: Secondary | ICD-10-CM | POA: Diagnosis not present

## 2022-10-05 DIAGNOSIS — K219 Gastro-esophageal reflux disease without esophagitis: Secondary | ICD-10-CM | POA: Diagnosis not present

## 2022-10-05 DIAGNOSIS — N952 Postmenopausal atrophic vaginitis: Secondary | ICD-10-CM | POA: Diagnosis not present

## 2022-10-05 DIAGNOSIS — Z6824 Body mass index (BMI) 24.0-24.9, adult: Secondary | ICD-10-CM | POA: Diagnosis not present

## 2022-10-05 DIAGNOSIS — Z1151 Encounter for screening for human papillomavirus (HPV): Secondary | ICD-10-CM | POA: Diagnosis not present

## 2022-10-05 DIAGNOSIS — Z124 Encounter for screening for malignant neoplasm of cervix: Secondary | ICD-10-CM | POA: Diagnosis not present

## 2022-10-05 DIAGNOSIS — Z13820 Encounter for screening for osteoporosis: Secondary | ICD-10-CM | POA: Diagnosis not present

## 2022-10-05 DIAGNOSIS — N958 Other specified menopausal and perimenopausal disorders: Secondary | ICD-10-CM | POA: Diagnosis not present

## 2022-10-08 DIAGNOSIS — K1321 Leukoplakia of oral mucosa, including tongue: Secondary | ICD-10-CM | POA: Diagnosis not present

## 2022-10-08 DIAGNOSIS — C029 Malignant neoplasm of tongue, unspecified: Secondary | ICD-10-CM | POA: Diagnosis not present

## 2022-10-21 DIAGNOSIS — F4322 Adjustment disorder with anxiety: Secondary | ICD-10-CM | POA: Diagnosis not present

## 2022-11-11 DIAGNOSIS — L821 Other seborrheic keratosis: Secondary | ICD-10-CM | POA: Diagnosis not present

## 2022-11-11 DIAGNOSIS — L814 Other melanin hyperpigmentation: Secondary | ICD-10-CM | POA: Diagnosis not present

## 2023-01-12 DIAGNOSIS — R0789 Other chest pain: Secondary | ICD-10-CM | POA: Diagnosis not present

## 2023-01-12 DIAGNOSIS — I700 Atherosclerosis of aorta: Secondary | ICD-10-CM | POA: Diagnosis not present

## 2023-01-12 DIAGNOSIS — Z6824 Body mass index (BMI) 24.0-24.9, adult: Secondary | ICD-10-CM | POA: Diagnosis not present

## 2023-01-12 DIAGNOSIS — R030 Elevated blood-pressure reading, without diagnosis of hypertension: Secondary | ICD-10-CM | POA: Diagnosis not present

## 2023-01-13 ENCOUNTER — Other Ambulatory Visit (HOSPITAL_BASED_OUTPATIENT_CLINIC_OR_DEPARTMENT_OTHER): Payer: Self-pay | Admitting: Pain Medicine

## 2023-01-13 DIAGNOSIS — R079 Chest pain, unspecified: Secondary | ICD-10-CM

## 2023-01-14 ENCOUNTER — Ambulatory Visit (HOSPITAL_BASED_OUTPATIENT_CLINIC_OR_DEPARTMENT_OTHER)
Admission: RE | Admit: 2023-01-14 | Discharge: 2023-01-14 | Disposition: A | Discharge status: Home / Self Care | Payer: Medicare Other | Source: Ambulatory Visit | Attending: Pain Medicine | Admitting: Pain Medicine

## 2023-01-14 DIAGNOSIS — R079 Chest pain, unspecified: Secondary | ICD-10-CM | POA: Diagnosis not present

## 2023-01-14 DIAGNOSIS — I6523 Occlusion and stenosis of bilateral carotid arteries: Secondary | ICD-10-CM | POA: Diagnosis not present

## 2023-01-14 DIAGNOSIS — K1321 Leukoplakia of oral mucosa, including tongue: Secondary | ICD-10-CM | POA: Diagnosis not present

## 2023-01-14 DIAGNOSIS — Z85810 Personal history of malignant neoplasm of tongue: Secondary | ICD-10-CM | POA: Diagnosis not present

## 2023-02-10 DIAGNOSIS — H2513 Age-related nuclear cataract, bilateral: Secondary | ICD-10-CM | POA: Diagnosis not present

## 2023-02-10 DIAGNOSIS — H35371 Puckering of macula, right eye: Secondary | ICD-10-CM | POA: Diagnosis not present

## 2023-02-10 DIAGNOSIS — H25013 Cortical age-related cataract, bilateral: Secondary | ICD-10-CM | POA: Diagnosis not present

## 2023-02-10 DIAGNOSIS — H52201 Unspecified astigmatism, right eye: Secondary | ICD-10-CM | POA: Diagnosis not present

## 2023-02-10 DIAGNOSIS — H524 Presbyopia: Secondary | ICD-10-CM | POA: Diagnosis not present

## 2023-03-16 NOTE — Progress Notes (Signed)
CARDIOLOGY CONSULT NOTE       Patient ID: Joan Boyle MRN: [id number] DOB/AGE: Aug 31, 1955 67 y.o.  Admit date: (Not on file) Referring Physician: Linton Rump Primary Physician: Sigmund Hazel, MD Primary Cardiologist: New Reason for Consultation: Chest pain/Palpitations    HPI:  67 y.o. referred by Linton Rump for chest pain and palpitations History of asthma, malignancy of tongue/leukoplakia follows with Pollyann Kennedy and GERD Resting right arm pain and tightness in chest since March. Plays tennis and feels pain is different than her arthritis. Lasts < 10 minutes had episode grocery store with radiation to right axilla Some exertional dyspnea as well.   Labs with negative d dimer normal renal function  K 4.4 Hct 43 ECG with possible LAE flat ST segments in inferior leads 01/12/23   Carotid duplex 01/16/23 normal no significant plaque   Her pain is mostly not with exertion she plays lots of tennis and does not have pain then  She is retired Garment/textile technologist. 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 No pertinent family history.  Social History   Socioeconomic History   Marital status: Married    Spouse name: Not on file   Number of children: Not on file   Years of education: Not on file   Highest education level: Not on file  Occupational History   Not on file  Tobacco Use   Smoking status: Never   Smokeless tobacco: Never  Vaping Use   Vaping status: Never Used  Substance and Sexual Activity   Alcohol use: Yes    Comment: occasionally   Drug use: No   Sexual activity: Not on file  Other Topics Concern   Not  on file  Social History Narrative   Not on file   Social Determinants of Health   Financial Resource Strain: Not on file  Food Insecurity: Low Risk  (01/14/2023)   Received from [facility]   Food vital sign    Within the past 12 months, you worried that your food would run out before you got money to buy more: Never true    Within the past 12 months, the food you bought just didn't last and you didn't have money to get more. : Never true  Transportation Needs: Not on file (01/14/2023)  Physical Activity: Not on file  Stress: Not on file  Social Connections: Not on file  Intimate Partner Violence: Not on file    Past Surgical History:  Procedure Laterality Date   EXCISION OF TONGUE LESION WITH LASER  2011   EXCISION OF TONGUE LESION WITH LASER Right 05/29/2019   Procedure: Excision Right Tongue Lesion w/CO2 laser, Frozen section;  Surgeon: Serena Colonel, MD;  Location: [facility];  Service: ENT;  Laterality: Right;   EXCISION OF TONGUE LESION WITH LASER N/A 06/25/2021   Procedure: ORAL CAVITY BIOPSY WITH CO2 LASER APPLICATION;  Surgeon: Serena Colonel, MD;  Location: [facility] OR;  Service: ENT;  Laterality: N/A;   EXCISION ORAL LESION WITH CO2 LASER Right 03/25/2020   Procedure: CO2 LASER EXCISION W/BIOPSY OF POSTERIOR TONGUE;  Surgeon: Serena Colonel, MD;  Location[facility];  Service: ENT;  Laterality: Right;   GLOSSECTOMY Right 06/21/2019   Procedure: GLOSSECTOMY  Partial;  Surgeon: Serena Colonel, MD;  Location: [facility] OR;  Service: ENT;  Laterality: Right;   MINOR C02 LASER EXCISION OF ORAL LESION Right 12/11/2013   Procedure: CO2 LASER ABLATION RIGHT SIDE OF TONGUE   (MINOR PROCEDURE) ;  Surgeon: Serena Colonel, MD;  Location: [facility];  Service: ENT;  Laterality: Right;   SALIVARY STONE REMOVAL  08/01/2012   Procedure: SALIVARY STONE REMOVAL;  Surgeon: Serena Colonel, MD;  Location: [facility];  Service: ENT;  Laterality: Bilateral;  FLOOR  OF MOUTH EXCISION POSSIBLE SUBMANDIBULAR DUCT RE-ROUTE   WISDOM TOOTH EXTRACTION        Current Outpatient Medications:    acetaminophen (TYLENOL) 500 MG tablet, Take 500 mg by mouth every 6 (six) hours as needed (pain/headaches.)., Disp: , Rfl:    albuterol (PROVENTIL HFA;VENTOLIN HFA) 108 (90 BASE) MCG/ACT inhaler, Inhale into the lungs every 6 (six) hours as needed for wheezing or shortness of breath., Disp: , Rfl:    Calcium Carb-Cholecalciferol (CALTRATE 600+D3 PO), Take 1 tablet by mouth in the morning., Disp: , Rfl:    cholecalciferol (VITAMIN D) 25 MCG (1000 UNIT) tablet, Take 1,000 Units by mouth in the morning., Disp: , Rfl:    Coenzyme Q10 (COQ10) 200 MG CAPS, Take 200 mg by mouth in the morning., Disp: , Rfl:    EPINEPHrine 0.3 mg/0.3 mL IJ SOAJ injection, Inject 0.3 mg into the muscle as needed for anaphylaxis., Disp: , Rfl:    famotidine (PEPCID) 20 MG tablet, Take 20 mg by mouth at bedtime., Disp: , Rfl:    ibuprofen (ADVIL) 200 MG tablet, Take 400 mg by mouth daily as needed (headaches.)., Disp: , Rfl:    Multiple Vitamins-Minerals (AIRBORNE PO), Take 1 tablet by mouth in the morning., Disp: , Rfl:    rosuvastatin (CRESTOR) 5 MG tablet, Take 5 mg by mouth in the morning., Disp: , Rfl:    tretinoin (RETIN-A) 0.025 % cream, Apply 1 application topically every three (3) days as needed (skin blemishes)., Disp: , Rfl:    triamcinolone (KENALOG) 0.1 % paste, Use as directed 1 application in the mouth or throat 2 (two) times daily as needed (irritation)., Disp: , Rfl:     Physical Exam: Blood pressure 122/80, pulse 71, height 5\' 4"  (1.626 m), weight 140 lb 9.6 oz (63.8 kg), SpO2 98%.   Affect appropriate Healthy:  appears stated age HEENT: normal Neck supple with no adenopathy JVP normal no bruits no thyromegaly Lungs clear with no wheezing and good diaphragmatic motion Heart:  S1/S2 no murmur, no rub, gallop or click PMI normal Abdomen: benighn, BS positve, no tenderness, no  AAA no bruit.  No HSM or HJR Distal pulses intact with no bruits No edema Neuro non-focal Skin warm and dry No muscular weakness   Labs:   Lab Results  Component Value Date   WBC 4.6 06/25/2021   HGB 14.1 06/25/2021   HCT 43.4 06/25/2021   MCV 94.8 06/25/2021   PLT 166 06/25/2021   No results for input(s): "NA", "K", "CL", "CO2", "BUN", "CREATININE", "CALCIUM", "PROT", "BILITOT", "ALKPHOS", "ALT", "AST", "GLUCOSE" in the last 168 hours.  Invalid input(s): "LABALBU" No results found for: "CKTOTAL", "CKMB", "CKMBINDEX", "TROPONINI" No results found for: "CHOL" No results found for: "HDL" No results found for: "LDLCALC" No results found for: "TRIG" No results found  for: "CHOLHDL" No results found for: "LDLDIRECT"    Radiology: No results found.  EKG: see HPI   ASSESSMENT AND PLAN:   Chest Pain: atypical flat ST segments on ECG are non specific Strong family history Shared decision making favor gated cardiac CTA to further risk stratify. Normal renal function no contrast allergy GERD : low carb diet continue pepcid Asthma: no active wheezing PRN Proventil HLD:  on crestor labs with primary target pending calcium score   Cardiac CTA Lopressor 50 mg 2 hours before  F/U cardiology PRN pending results   Signed: Charlton Haws 03/25/2023, 8:34 AM

## 2023-03-25 ENCOUNTER — Encounter: Payer: Self-pay | Admitting: Cardiovascular Disease

## 2023-03-25 ENCOUNTER — Ambulatory Visit: Payer: Medicare Other | Attending: Cardiovascular Disease | Admitting: Cardiovascular Disease

## 2023-03-25 VITALS — BP 122/80 | HR 71 | Ht 64.0 in | Wt 140.6 lb

## 2023-03-25 DIAGNOSIS — E782 Mixed hyperlipidemia: Secondary | ICD-10-CM

## 2023-03-25 DIAGNOSIS — R072 Precordial pain: Secondary | ICD-10-CM | POA: Diagnosis not present

## 2023-03-25 MED ORDER — METOPROLOL TARTRATE 50 MG PO TABS: ORAL_TABLET | ORAL | 0 refills | Status: AC

## 2023-03-25 NOTE — Patient Instructions (Signed)
Medication Instructions:  Your physician recommends that you continue on your current medications as directed. Please refer to the Current Medication list given to you today.  *If you need a refill on your cardiac medications before your next appointment, please call your pharmacy*  Lab Work: Your physician recommends that you have lab work today. BMET  If you have labs (blood work) drawn today and your tests are completely normal, you will receive your results only by: MyChart Message (if you have MyChart) OR A paper copy in the mail If you have any lab test that is abnormal or we need to change your treatment, we will call you to review the results.  Follow-Up: At [facility], you and your health needs are our priority.  As part of our continuing mission to provide you with exceptional heart care, we have created designated Provider Care Teams.  These Care Teams include your primary Cardiologist (physician) and Advanced Practice Providers (APPs -  Physician Assistants and Nurse Practitioners) who all work together to provide you with the care you need, when you need it.  We recommend signing up for the patient portal called "MyChart".  Sign up information is provided on this After Visit Summary.  MyChart is used to connect with patients for Virtual Visits (Telemedicine).  Patients are able to view lab/test results, encounter notes, upcoming appointments, etc.  Non-urgent messages can be sent to your provider as well.   To learn more about what you can do with MyChart, go to [email or web address].    Your next appointment:   As needed  Provider:   Charlton Haws, MD     Other Instructions    Your cardiac CT will be scheduled at one of the below locations:   [facility] [address] [place], [place] [address] [phone]   If scheduled at [facility], please arrive at the [facility] and Children's Entrance (Entrance C2) of [facility] 30  minutes prior to test start time. You can use the FREE valet parking offered at entrance C (encouraged to control the heart rate for the test)  Proceed to the [facility] Radiology Department (first floor) to check-in and test prep.  All radiology patients and guests should use entrance C2 at [facility], accessed from [facility], even though the hospital's physical address listed is [address].     Please follow these instructions carefully (unless otherwise directed):  An IV will be required for this test and Nitroglycerin will be given.   On the Night Before the Test: Be sure to Drink plenty of water. Do not consume any caffeinated/decaffeinated beverages or chocolate 12 hours prior to your test. Do not take any antihistamines 12 hours prior to your test.  On the Day of the Test: Drink plenty of water until 1 hour prior to the test. Do not eat any food 1 hour prior to test. You may take your regular medications prior to the test.  Take metoprolol (Lopressor) two hours prior to test. FEMALES- please wear underwire-free bra if available, avoid dresses & tight clothing       After the Test: Drink plenty of water. After receiving IV contrast, you may experience a mild flushed feeling. This is normal. On occasion, you may experience a mild rash up to 24 hours after the test. This is not dangerous. If this occurs, you can take Benadryl 25 mg and increase your fluid intake. If you experience trouble breathing, this  can be serious. If it is severe call 911 IMMEDIATELY. If it is mild, please call our office.   We will call to schedule your test 2-4 weeks out understanding that some insurance companies will need an authorization prior to the service being performed.   For more information and frequently asked questions, please visit our website : [email or web address]  For non-scheduling related questions, please contact the cardiac imaging  nurse navigator should you have any questions/concerns: Cardiac Imaging Nurse Navigators Direct Office Dial: [phone]   For scheduling needs, including cancellations and rescheduling, please call [place], [phone].

## 2023-03-26 ENCOUNTER — Encounter (HOSPITAL_COMMUNITY): Payer: Self-pay

## 2023-03-26 LAB — BASIC METABOLIC PANEL
BUN/Creatinine Ratio: 15 (ref 12–28)
BUN: 12 mg/dL (ref 8–27)
CO2: 22 mmol/L (ref 20–29)
Calcium: 9.5 mg/dL (ref 8.7–10.3)
Chloride: 107 mmol/L — ABNORMAL HIGH (ref 96–106)
Creatinine, Ser: 0.78 mg/dL (ref 0.57–1.00)
Glucose: 90 mg/dL (ref 70–99)
Potassium: 4.2 mmol/L (ref 3.5–5.2)
Sodium: 142 mmol/L (ref 134–144)
eGFR: 83 mL/min/{1.73_m2} (ref >59)

## 2023-03-30 ENCOUNTER — Ambulatory Visit (HOSPITAL_COMMUNITY)
Admission: RE | Admit: 2023-03-30 | Discharge: 2023-03-30 | Disposition: A | Discharge status: Home / Self Care | Payer: Medicare Other | Source: Ambulatory Visit | Attending: Cardiovascular Disease | Admitting: Cardiovascular Disease

## 2023-03-30 DIAGNOSIS — R072 Precordial pain: Secondary | ICD-10-CM | POA: Diagnosis not present

## 2023-03-30 MED ORDER — NITROGLYCERIN 0.4 MG SL SUBL
0.8000 mg | SUBLINGUAL_TABLET | Freq: Once | SUBLINGUAL | Status: AC
Start: 2023-03-30 — End: 2023-03-30
  Administered 2023-03-30: 0.8 mg via SUBLINGUAL

## 2023-03-30 MED ORDER — IOHEXOL 350 MG/ML SOLN
95.0000 mL | Freq: Once | INTRAVENOUS | Status: AC | PRN
  Administered 2023-03-30: 95 mL via INTRAVENOUS

## 2023-03-30 MED ORDER — NITROGLYCERIN 0.4 MG SL SUBL
SUBLINGUAL_TABLET | SUBLINGUAL | Status: AC
  Filled 2023-03-30: qty 2

## 2023-04-20 DIAGNOSIS — Z23 Encounter for immunization: Secondary | ICD-10-CM | POA: Diagnosis not present

## 2023-04-27 DIAGNOSIS — C029 Malignant neoplasm of tongue, unspecified: Secondary | ICD-10-CM | POA: Diagnosis not present

## 2023-04-27 DIAGNOSIS — K1321 Leukoplakia of oral mucosa, including tongue: Secondary | ICD-10-CM | POA: Diagnosis not present

## 2023-05-03 ENCOUNTER — Encounter (HOSPITAL_COMMUNITY): Payer: Self-pay

## 2023-05-03 ENCOUNTER — Other Ambulatory Visit (HOSPITAL_COMMUNITY): Payer: Self-pay

## 2023-05-03 DIAGNOSIS — Z23 Encounter for immunization: Secondary | ICD-10-CM | POA: Diagnosis not present

## 2023-05-03 MED ORDER — COVID-19 MRNA VAC-TRIS(PFIZER) 30 MCG/0.3ML IM SUSY
0.3000 mL | PREFILLED_SYRINGE | Freq: Once | INTRAMUSCULAR | 0 refills | Status: DC
  Filled 2023-05-03: qty 0.3, 1d supply, fill #0

## 2023-05-05 ENCOUNTER — Other Ambulatory Visit: Payer: Self-pay | Admitting: Family Medicine

## 2023-05-05 DIAGNOSIS — Z1231 Encounter for screening mammogram for malignant neoplasm of breast: Secondary | ICD-10-CM

## 2023-05-31 ENCOUNTER — Ambulatory Visit
Admission: RE | Admit: 2023-05-31 | Discharge: 2023-05-31 | Disposition: A | Discharge status: Home / Self Care | Payer: Medicare Other | Source: Ambulatory Visit

## 2023-05-31 DIAGNOSIS — Z1231 Encounter for screening mammogram for malignant neoplasm of breast: Secondary | ICD-10-CM | POA: Diagnosis not present

## 2023-06-29 DIAGNOSIS — D692 Other nonthrombocytopenic purpura: Secondary | ICD-10-CM | POA: Diagnosis not present

## 2023-06-29 DIAGNOSIS — D485 Neoplasm of uncertain behavior of skin: Secondary | ICD-10-CM | POA: Diagnosis not present

## 2023-06-29 DIAGNOSIS — C4361 Malignant melanoma of right upper limb, including shoulder: Secondary | ICD-10-CM | POA: Diagnosis not present

## 2023-06-29 DIAGNOSIS — L820 Inflamed seborrheic keratosis: Secondary | ICD-10-CM | POA: Diagnosis not present

## 2023-06-29 DIAGNOSIS — D1801 Hemangioma of skin and subcutaneous tissue: Secondary | ICD-10-CM | POA: Diagnosis not present

## 2023-06-29 DIAGNOSIS — L821 Other seborrheic keratosis: Secondary | ICD-10-CM | POA: Diagnosis not present

## 2023-06-29 DIAGNOSIS — D2262 Melanocytic nevi of left upper limb, including shoulder: Secondary | ICD-10-CM | POA: Diagnosis not present

## 2023-06-29 DIAGNOSIS — L814 Other melanin hyperpigmentation: Secondary | ICD-10-CM | POA: Diagnosis not present

## 2023-07-13 DIAGNOSIS — K1321 Leukoplakia of oral mucosa, including tongue: Secondary | ICD-10-CM | POA: Diagnosis not present

## 2023-07-23 DIAGNOSIS — L988 Other specified disorders of the skin and subcutaneous tissue: Secondary | ICD-10-CM | POA: Diagnosis not present

## 2023-07-23 DIAGNOSIS — C4361 Malignant melanoma of right upper limb, including shoulder: Secondary | ICD-10-CM | POA: Diagnosis not present

## 2023-07-23 DIAGNOSIS — Z85820 Personal history of malignant melanoma of skin: Secondary | ICD-10-CM | POA: Diagnosis not present

## 2023-10-06 DIAGNOSIS — Z91030 Bee allergy status: Secondary | ICD-10-CM | POA: Diagnosis not present

## 2023-10-06 DIAGNOSIS — J45990 Exercise induced bronchospasm: Secondary | ICD-10-CM | POA: Diagnosis not present

## 2023-10-06 DIAGNOSIS — Z85810 Personal history of malignant neoplasm of tongue: Secondary | ICD-10-CM | POA: Diagnosis not present

## 2023-10-06 DIAGNOSIS — K219 Gastro-esophageal reflux disease without esophagitis: Secondary | ICD-10-CM | POA: Diagnosis not present

## 2023-10-06 DIAGNOSIS — Z1331 Encounter for screening for depression: Secondary | ICD-10-CM | POA: Diagnosis not present

## 2023-10-06 DIAGNOSIS — Z79899 Other long term (current) drug therapy: Secondary | ICD-10-CM | POA: Diagnosis not present

## 2023-10-06 DIAGNOSIS — Z6824 Body mass index (BMI) 24.0-24.9, adult: Secondary | ICD-10-CM | POA: Diagnosis not present

## 2023-10-06 DIAGNOSIS — I700 Atherosclerosis of aorta: Secondary | ICD-10-CM | POA: Diagnosis not present

## 2023-10-06 DIAGNOSIS — Z85820 Personal history of malignant melanoma of skin: Secondary | ICD-10-CM | POA: Diagnosis not present

## 2023-10-06 DIAGNOSIS — Z0000 Encounter for general adult medical examination without abnormal findings: Secondary | ICD-10-CM | POA: Diagnosis not present

## 2023-10-11 DIAGNOSIS — Z85810 Personal history of malignant neoplasm of tongue: Secondary | ICD-10-CM | POA: Diagnosis not present

## 2023-10-11 DIAGNOSIS — Z08 Encounter for follow-up examination after completed treatment for malignant neoplasm: Secondary | ICD-10-CM | POA: Diagnosis not present

## 2023-10-19 DIAGNOSIS — Z6824 Body mass index (BMI) 24.0-24.9, adult: Secondary | ICD-10-CM | POA: Diagnosis not present

## 2023-10-19 DIAGNOSIS — Z01419 Encounter for gynecological examination (general) (routine) without abnormal findings: Secondary | ICD-10-CM | POA: Diagnosis not present

## 2023-10-28 DIAGNOSIS — D2262 Melanocytic nevi of left upper limb, including shoulder: Secondary | ICD-10-CM | POA: Diagnosis not present

## 2023-10-28 DIAGNOSIS — L570 Actinic keratosis: Secondary | ICD-10-CM | POA: Diagnosis not present

## 2023-10-28 DIAGNOSIS — Z85820 Personal history of malignant melanoma of skin: Secondary | ICD-10-CM | POA: Diagnosis not present

## 2023-10-28 DIAGNOSIS — L918 Other hypertrophic disorders of the skin: Secondary | ICD-10-CM | POA: Diagnosis not present

## 2023-10-28 DIAGNOSIS — D1801 Hemangioma of skin and subcutaneous tissue: Secondary | ICD-10-CM | POA: Diagnosis not present

## 2023-10-28 DIAGNOSIS — L821 Other seborrheic keratosis: Secondary | ICD-10-CM | POA: Diagnosis not present

## 2023-10-28 DIAGNOSIS — L814 Other melanin hyperpigmentation: Secondary | ICD-10-CM | POA: Diagnosis not present

## 2023-12-31 DIAGNOSIS — Z6823 Body mass index (BMI) 23.0-23.9, adult: Secondary | ICD-10-CM | POA: Diagnosis not present

## 2023-12-31 DIAGNOSIS — J069 Acute upper respiratory infection, unspecified: Secondary | ICD-10-CM | POA: Diagnosis not present

## 2024-01-13 DIAGNOSIS — M722 Plantar fascial fibromatosis: Secondary | ICD-10-CM | POA: Diagnosis not present

## 2024-01-19 DIAGNOSIS — L570 Actinic keratosis: Secondary | ICD-10-CM | POA: Diagnosis not present

## 2024-01-19 DIAGNOSIS — Z85820 Personal history of malignant melanoma of skin: Secondary | ICD-10-CM | POA: Diagnosis not present

## 2024-01-19 DIAGNOSIS — D1801 Hemangioma of skin and subcutaneous tissue: Secondary | ICD-10-CM | POA: Diagnosis not present

## 2024-01-19 DIAGNOSIS — L821 Other seborrheic keratosis: Secondary | ICD-10-CM | POA: Diagnosis not present

## 2024-02-17 DIAGNOSIS — Z6824 Body mass index (BMI) 24.0-24.9, adult: Secondary | ICD-10-CM | POA: Diagnosis not present

## 2024-02-17 DIAGNOSIS — H2513 Age-related nuclear cataract, bilateral: Secondary | ICD-10-CM | POA: Diagnosis not present

## 2024-02-17 DIAGNOSIS — H25013 Cortical age-related cataract, bilateral: Secondary | ICD-10-CM | POA: Diagnosis not present

## 2024-02-17 DIAGNOSIS — H43813 Vitreous degeneration, bilateral: Secondary | ICD-10-CM | POA: Diagnosis not present

## 2024-02-17 DIAGNOSIS — H52203 Unspecified astigmatism, bilateral: Secondary | ICD-10-CM | POA: Diagnosis not present

## 2024-02-17 DIAGNOSIS — H35371 Puckering of macula, right eye: Secondary | ICD-10-CM | POA: Diagnosis not present

## 2024-02-17 DIAGNOSIS — J209 Acute bronchitis, unspecified: Secondary | ICD-10-CM | POA: Diagnosis not present

## 2024-02-17 DIAGNOSIS — Z23 Encounter for immunization: Secondary | ICD-10-CM | POA: Diagnosis not present

## 2024-04-11 DIAGNOSIS — C029 Malignant neoplasm of tongue, unspecified: Secondary | ICD-10-CM | POA: Diagnosis not present

## 2024-04-11 DIAGNOSIS — K1321 Leukoplakia of oral mucosa, including tongue: Secondary | ICD-10-CM | POA: Diagnosis not present

## 2024-05-01 DIAGNOSIS — D225 Melanocytic nevi of trunk: Secondary | ICD-10-CM | POA: Diagnosis not present

## 2024-05-01 DIAGNOSIS — D1801 Hemangioma of skin and subcutaneous tissue: Secondary | ICD-10-CM | POA: Diagnosis not present

## 2024-05-01 DIAGNOSIS — L814 Other melanin hyperpigmentation: Secondary | ICD-10-CM | POA: Diagnosis not present

## 2024-05-01 DIAGNOSIS — D2262 Melanocytic nevi of left upper limb, including shoulder: Secondary | ICD-10-CM | POA: Diagnosis not present

## 2024-05-01 DIAGNOSIS — L821 Other seborrheic keratosis: Secondary | ICD-10-CM | POA: Diagnosis not present

## 2024-05-01 DIAGNOSIS — D0462 Carcinoma in situ of skin of left upper limb, including shoulder: Secondary | ICD-10-CM | POA: Diagnosis not present

## 2024-05-01 DIAGNOSIS — D485 Neoplasm of uncertain behavior of skin: Secondary | ICD-10-CM | POA: Diagnosis not present

## 2024-05-01 DIAGNOSIS — Z85820 Personal history of malignant melanoma of skin: Secondary | ICD-10-CM | POA: Diagnosis not present

## 2024-05-01 DIAGNOSIS — L918 Other hypertrophic disorders of the skin: Secondary | ICD-10-CM | POA: Diagnosis not present

## 2024-05-08 ENCOUNTER — Other Ambulatory Visit: Payer: Self-pay | Admitting: Family Medicine

## 2024-05-08 DIAGNOSIS — Z1231 Encounter for screening mammogram for malignant neoplasm of breast: Secondary | ICD-10-CM

## 2024-05-09 DIAGNOSIS — D0462 Carcinoma in situ of skin of left upper limb, including shoulder: Secondary | ICD-10-CM | POA: Diagnosis not present

## 2024-05-18 DIAGNOSIS — Z23 Encounter for immunization: Secondary | ICD-10-CM | POA: Diagnosis not present

## 2024-06-06 ENCOUNTER — Ambulatory Visit
Admission: RE | Admit: 2024-06-06 | Discharge: 2024-06-06 | Disposition: A | Source: Ambulatory Visit | Attending: Family Medicine | Admitting: Family Medicine

## 2024-06-06 DIAGNOSIS — Z1231 Encounter for screening mammogram for malignant neoplasm of breast: Secondary | ICD-10-CM | POA: Diagnosis not present

## 2024-06-23 ENCOUNTER — Other Ambulatory Visit: Payer: Self-pay | Admitting: Oral Surgery

## 2024-06-23 DIAGNOSIS — C031 Malignant neoplasm of lower gum: Secondary | ICD-10-CM | POA: Diagnosis not present

## 2024-06-27 LAB — SURGICAL PATHOLOGY

## 2024-07-06 DIAGNOSIS — C062 Malignant neoplasm of retromolar area: Secondary | ICD-10-CM | POA: Diagnosis not present

## 2024-07-28 ENCOUNTER — Other Ambulatory Visit (HOSPITAL_BASED_OUTPATIENT_CLINIC_OR_DEPARTMENT_OTHER): Payer: Self-pay
